# Patient Record
Sex: Male | Born: 1965 | Race: White | Hispanic: No | Marital: Married | Smoking: Current some day smoker
Health system: Southern US, Community
[De-identification: ages and names within clinical notes are randomized; demographics above are authoritative.]

## PROBLEM LIST (undated history)

## (undated) DIAGNOSIS — F329 Major depressive disorder, single episode, unspecified: Secondary | ICD-10-CM

## (undated) DIAGNOSIS — I499 Cardiac arrhythmia, unspecified: Secondary | ICD-10-CM

## (undated) DIAGNOSIS — I10 Essential (primary) hypertension: Secondary | ICD-10-CM

## (undated) DIAGNOSIS — E785 Hyperlipidemia, unspecified: Secondary | ICD-10-CM

## (undated) DIAGNOSIS — IMO0002 Reserved for concepts with insufficient information to code with codable children: Secondary | ICD-10-CM

## (undated) DIAGNOSIS — F32A Depression, unspecified: Secondary | ICD-10-CM

## (undated) DIAGNOSIS — N200 Calculus of kidney: Secondary | ICD-10-CM

## (undated) DIAGNOSIS — N189 Chronic kidney disease, unspecified: Secondary | ICD-10-CM

## (undated) DIAGNOSIS — F419 Anxiety disorder, unspecified: Secondary | ICD-10-CM

## (undated) HISTORY — PX: COLONOSCOPY: SHX174

## (undated) HISTORY — PX: LITHOTRIPSY: SUR834

## (undated) HISTORY — PX: POLYPECTOMY: SHX149

## (undated) HISTORY — DX: Hyperlipidemia, unspecified: E78.5

## (undated) HISTORY — DX: Cardiac arrhythmia, unspecified: I49.9

---

## 2001-02-02 ENCOUNTER — Emergency Department (HOSPITAL_COMMUNITY): Admission: EM | Admit: 2001-02-02 | Discharge: 2001-02-02 | Payer: Self-pay | Admitting: *Deleted

## 2001-02-02 ENCOUNTER — Encounter: Payer: Self-pay | Admitting: *Deleted

## 2001-02-10 ENCOUNTER — Encounter: Payer: Self-pay | Admitting: Urology

## 2001-02-10 ENCOUNTER — Ambulatory Visit (HOSPITAL_COMMUNITY): Admission: RE | Admit: 2001-02-10 | Discharge: 2001-02-10 | Payer: Self-pay | Admitting: Urology

## 2002-01-06 ENCOUNTER — Observation Stay (HOSPITAL_COMMUNITY): Admission: EM | Admit: 2002-01-06 | Discharge: 2002-01-07 | Payer: Self-pay | Admitting: Emergency Medicine

## 2002-01-06 ENCOUNTER — Encounter: Payer: Self-pay | Admitting: Cardiology

## 2002-01-07 ENCOUNTER — Encounter: Payer: Self-pay | Admitting: Cardiology

## 2003-12-23 ENCOUNTER — Encounter: Admission: RE | Admit: 2003-12-23 | Discharge: 2003-12-30 | Payer: Self-pay | Admitting: Internal Medicine

## 2005-02-02 ENCOUNTER — Emergency Department (HOSPITAL_COMMUNITY): Admission: EM | Admit: 2005-02-02 | Discharge: 2005-02-02 | Payer: Self-pay | Admitting: Emergency Medicine

## 2006-04-16 ENCOUNTER — Ambulatory Visit: Payer: Self-pay | Admitting: Gastroenterology

## 2007-12-26 ENCOUNTER — Emergency Department (HOSPITAL_COMMUNITY): Admission: EM | Admit: 2007-12-26 | Discharge: 2007-12-27 | Payer: Self-pay | Admitting: Emergency Medicine

## 2008-06-17 ENCOUNTER — Emergency Department (HOSPITAL_COMMUNITY): Admission: EM | Admit: 2008-06-17 | Discharge: 2008-06-17 | Payer: Self-pay | Admitting: Emergency Medicine

## 2008-06-17 ENCOUNTER — Encounter (INDEPENDENT_AMBULATORY_CARE_PROVIDER_SITE_OTHER): Payer: Self-pay | Admitting: *Deleted

## 2008-10-07 ENCOUNTER — Encounter (INDEPENDENT_AMBULATORY_CARE_PROVIDER_SITE_OTHER): Payer: Self-pay | Admitting: *Deleted

## 2008-10-07 ENCOUNTER — Ambulatory Visit (HOSPITAL_COMMUNITY): Admission: RE | Admit: 2008-10-07 | Discharge: 2008-10-07 | Payer: Self-pay | Admitting: Urology

## 2008-10-28 ENCOUNTER — Ambulatory Visit (HOSPITAL_COMMUNITY): Admission: RE | Admit: 2008-10-28 | Discharge: 2008-10-28 | Payer: Self-pay | Admitting: Urology

## 2008-10-28 ENCOUNTER — Encounter (INDEPENDENT_AMBULATORY_CARE_PROVIDER_SITE_OTHER): Payer: Self-pay | Admitting: *Deleted

## 2009-02-24 ENCOUNTER — Encounter: Payer: Self-pay | Admitting: Gastroenterology

## 2009-03-24 ENCOUNTER — Ambulatory Visit: Payer: Self-pay | Admitting: Gastroenterology

## 2009-03-24 DIAGNOSIS — K921 Melena: Secondary | ICD-10-CM

## 2009-04-07 ENCOUNTER — Ambulatory Visit: Payer: Self-pay | Admitting: Gastroenterology

## 2009-04-07 ENCOUNTER — Encounter: Payer: Self-pay | Admitting: Gastroenterology

## 2009-04-08 ENCOUNTER — Encounter (INDEPENDENT_AMBULATORY_CARE_PROVIDER_SITE_OTHER): Payer: Self-pay

## 2009-04-11 ENCOUNTER — Encounter: Payer: Self-pay | Admitting: Gastroenterology

## 2009-05-16 ENCOUNTER — Ambulatory Visit: Payer: Self-pay | Admitting: Gastroenterology

## 2010-07-29 ENCOUNTER — Encounter: Payer: Self-pay | Admitting: Gastroenterology

## 2010-07-29 ENCOUNTER — Encounter: Payer: Self-pay | Admitting: Family Medicine

## 2010-10-18 LAB — BASIC METABOLIC PANEL
BUN: 10 mg/dL (ref 6–23)
CO2: 26 mEq/L (ref 19–32)
Calcium: 8.6 mg/dL (ref 8.4–10.5)
Chloride: 107 mEq/L (ref 96–112)
Creatinine, Ser: 0.79 mg/dL (ref 0.4–1.5)
GFR calc non Af Amer: >60 mL/min (ref >60)
Glucose, Bld: 107 mg/dL — ABNORMAL HIGH (ref 70–99)
Potassium: 3.7 mEq/L (ref 3.5–5.1)
Sodium: 139 mEq/L (ref 135–145)

## 2010-10-18 LAB — HEMOGLOBIN AND HEMATOCRIT, BLOOD
HCT: 43.2 % (ref 39.0–52.0)
Hemoglobin: 14.6 g/dL (ref 13.0–17.0)

## 2010-10-27 ENCOUNTER — Other Ambulatory Visit: Payer: Self-pay

## 2010-10-27 ENCOUNTER — Other Ambulatory Visit: Payer: Self-pay | Admitting: Family Medicine

## 2010-10-27 DIAGNOSIS — M25562 Pain in left knee: Secondary | ICD-10-CM

## 2010-10-28 ENCOUNTER — Ambulatory Visit
Admission: RE | Admit: 2010-10-28 | Discharge: 2010-10-28 | Disposition: A | Discharge status: Home / Self Care | Payer: 59 | Source: Ambulatory Visit | Attending: Family Medicine | Admitting: Family Medicine

## 2010-10-28 DIAGNOSIS — M25562 Pain in left knee: Secondary | ICD-10-CM

## 2010-11-21 NOTE — Op Note (Signed)
[name] [name], [name] [name]              ACCOUNT NO.:  [id number]   MEDICAL RECORD NO.:  [id number]          PATIENT TYPE:  AMB   LOCATION:  DAY                          FACILITY:  [facility]   PHYSICIAN:  Mark C. Vernie Ammons, M.D.  DATE OF BIRTH:  1966/01/10   DATE OF PROCEDURE:  10/07/2008  DATE OF DISCHARGE:                               OPERATIVE REPORT   PREOPERATIVE DIAGNOSIS:  Right ureteral calculi.   POSTOPERATIVE DIAGNOSIS:  Impacted right ureteral calculi.   PROCEDURE:  1. Cystoscopy.  2. Right ureteroscopy.  3. Attempted double-J stent placement.   ANESTHESIA:  General.   SPECIMENS:  None.   DRAINS:  None.   BLOOD LOSS:  None.   COMPLICATIONS:  None.   INDICATIONS:  The patient is a 45 year old male who has two stones in  his upper right ureter.  He is been having pain and we had discussed the  treatment options.  He has elected to proceed with lithotripsy since the  stones were very large and in preparation for the lithotripsy and I had  recommended a double-J stent placement.  He is brought to the operating  room today for placement of a double-J stent and I have discussed the  risks, complications and alternatives with him.   DESCRIPTION OF OPERATION:  After informed consent, the patient was  brought to the major OR, placed on the table and administered general  anesthesia.  He was then moved to the dorsal lithotomy position and his  genitalia was sterilely prepped and draped and official time-out was  then performed.  Initially the 22-French cystoscope was passed directly  into the bladder per urethra with no abnormalities being noted along the  course of the urethra or in the prostatic urethra.  The bladder was then  entered with the 12 degrees lens and fully inspected and noted be free  of any tumor, stones or inflammatory lesions.  The right orifice was  then identified.   A 6-French open-ended ureteral catheter was then placed in the right  ureteral orifice and  through this a 0.038 inch floppy tip guidewire was  then passed up into the area of the ureteral stones under direct  fluoroscopic control.  I could not negotiate the guidewire passed the  stone.  I therefore passed the open-ended catheter up the ureter, over  the guidewire and just below the stone and again tried to manipulate the  guidewire passed the stone but was unsuccessful.  I therefore removed  the open-ended catheter and left the guidewire in place.   The 6-French flexible ureteroscope was then passed over the guidewire  and up the ureter and the guidewire was removed after the scope had been  positioned just below the stone.  I was able to visualize the stone and  it appeared to be impacted in the ureter.  I then tried to pass the  guidewire as well as a Glidewire under direct visualization around the  stone at all different aspects of the stone but was unsuccessful.  My  feeling was that further attempts to pass a guidewire increased the risk  of possible ureteral perforation and since the patient was scheduled for  lithotripsy of his stone later in the day  I felt no further attempt at  passing a guidewire beyond the stone should be performed.  Of note, the  holmium laser was unavailable as it was being used in another case.   I removed the ureteroscope and drained the bladder.  The patient was  awakened and taken to recovery room in stable and satisfactory  condition.  He tolerated the procedure well with no intraoperative  complications and will be taken to the lithotripsy unit and undergo  lithotripsy later today of his impacted stone and possibly the stone  more proximal to that.      Mark C. Vernie Ammons, M.D.  Electronically Signed     MCO/MEDQ  D:  10/07/2008  T:  10/07/2008  Job:  [id number]

## 2010-11-24 NOTE — Assessment & Plan Note (Signed)
[name]                           GASTROENTEROLOGY OFFICE NOTE   NAME:[name], [name] [name]                     MRN:          [id number]  DATE:04/16/2006                            DOB:          [birth year]    REFERRING PHYSICIAN:  Wardell Heath, MD   REASON FOR REFERRAL:  Neck pain and difficulty swallowing.   HISTORY OF PRESENT ILLNESS:  [name] [name] is a 45 year old white male who  relates intermittent difficulties with a cramp-like sensation in his neck  and throat that makes him feel as though he cannot swallow or breathe.  He  does have heartburn related to certain foods, but these symptoms are well  controlled with the p.r.n. use of TUMS.  His neck and throat cramping and  tightness does not appear to be related to reflux symptoms.  The symptoms  may last 1 to 2 minutes at a time and occur at anytime of the day or night.  They do not appear to be related to meals or swallowing.  He has no  odynophagia, abdominal pain, melena, hematochezia, change in bowel habits,  change in stool caliber.  His father has a history of adenomatous colon  polyps and also ALS.  The patient is very concerned that his symptoms may be  an early sign of ALS.   PAST MEDICAL HISTORY:  1. Hypertension.  2. History of chest pain, status post normal cardiac catheterization in      1998.  3. History of kidney stones, status post lithotripsy x2.  4. Status post right hand fracture.   CURRENT MEDICATIONS:  1. Zoloft q.d.  2. Hydrochlorothiazide q.d.  3. Hydrocodone p.r.n.   MEDICATION ALLERGIES:  NONE KNOWN.   SOCIAL HISTORY:  Per the handwritten form.   REVIEW OF SYMPTOMS:  Per the handwritten form.   PHYSICAL EXAMINATION:  VITAL SIGNS:  Height 5 feet 11 inches.  Weight 184.2  pounds.  Blood pressure is 124/82.  Pulse 80 and regular.  HEENT:  Anicteric sclerae.  Oropharynx clear.  NECK:  Without thyromegaly, adenopathy or tenderness.  Neck is supple.  CHEST:   Clear to auscultation bilaterally.  CARDIAC:  Regular rate and rhythm without murmurs appreciated.  ABDOMEN:  Abdomen is soft, non-tender and non-distended.  Normoactive bowel  sounds.  No palpable organomegaly, masses or hernias.  RECTAL:  Examination deferred.  NEUROLOGIC:  Alert and oriented x3.  Grossly nonfocal.   ASSESSMENT AND PLAN:  Neck and throat tightness.  I do not feel this is  likely gastrointestinal in etiology.  Schedule a barium esophagram for  further evaluation.  ENT referral for further evaluation.       Venita Lick. Russella Dar, MD, [facility]      MTS/MedQ  DD:  04/22/2006  DT:  04/23/2006  Job #:  [id number]   cc:   Paulene Floor, FNP

## 2010-11-24 NOTE — H&P (Signed)
[place]. [facility]  Patient:    [name] [name], [name] [name] Visit Number: [id number] MRN: [id number]          Service Type: OBV Location: 3700 [id number] Attending Physician:  Learta Codding Dictated by:   Lewayne Bunting, M.D. [facility] Admit Date:  01/06/2002 Discharge Date: 01/07/2002   CC:         Dr. Reola Calkins, [facility]   History and Physical  REFERRING PHYSICIAN:  Dr. Reola Calkins, [facility].  CARDIOLOGIST:  Samara Deist, M.D.  CHIEF COMPLAINT:  Chest pain times several years.  Troponin obtained today at 0.04.  HISTORY OF PRESENT ILLNESS:  Mr. [name] [name] is a 45 year old male with no prior history of coronary artery disease.  The patient is status post cardiac catheterization approximately to six or seven years ago and reportedly was within normal limits.  The patient has been recently seeing Dr. Reola Calkins for evaluation of recurrent left-sided substernal chest pain.  The patient reports left thoracic pain with burning deep inside.  He states it is associated with some tingling and numbness in both arms and fingers.  There is no definite correlation with exertion or with eating.  He does state that stress significantly worsens his chest pain.  It feels like he has a constant ache all day long with pain never truly going away.  He is actually a very active individual and denies any shortness of breath on exertion.  Presently in the emergency room tonight the patient was pain-free.  Dr. Reola Calkins had earlier today consulted with Dr. Graciela Husbands regarding the patients presentation, and Dr. Graciela Husbands recommended to obtain a troponin level.  Troponin level returned at 0.04, a borderline value.  The patient was subsequently taken to the emergency room at [facility] by his wife after consultation with Dr. Reola Calkins for further evaluation.  Of note, the patients symptoms are not _____, and he has been taking ibuprofen p.r.n.  ALLERGIES:  No known drug  allergies.  No iodine or shellfish allergy.  MEDICATIONS:  Ibuprofen p.r.n.  PAST MEDICAL HISTORY: 1. Cardiac catheterization seven years ago, possibly at [facility], no    records available, but per patient report negative. 2. Status post right lithotripsy x2. 3. Right hand fracture.  SOCIAL HISTORY:  The patient lives in [place] with his wife.  He works in Production designer, theatre/television/film.  He is married and has one child.  He has a 15-20 pack-year history of smoking.  Occasionally drinks alcohol.  He has a very remote history of possible substance abuse.  FAMILY HISTORY:  Noncontributory.  REVIEW OF SYSTEMS:  No fever, chills, or sweats.  No headache or sore throat. No dyspnea on exertion, orthopnea, or PND.  Occasional palpitations.  No frequency or dysuria.  No weakness but numbness as outlined above.  No nausea or vomiting.  PHYSICAL EXAMINATION:  VITAL SIGNS:  Blood pressure 161/93, respirations are 18, pulse is 80 beats per minute, saturation 98% on room air.  GENERAL:  A 45 year old male in no apparent distress.  HEENT:  PERRLA.  NECK:  Supple, normal carotid upstroke, no carotid bruits, no lymphadenopathy.  CHEST:  Clear breath sounds bilaterally.  CARDIAC:  Normal S1, S2, no murmurs, rubs, or gallops.  SKIN:  No rash or lesions.  ABDOMEN:  Soft, nontender, no rebound or guarding.  GENITOURINARY, RECTAL:  Deferred.  EXTREMITIES:  No cyanosis, clubbing, or edema.  NEUROLOGIC:  The patient is alert and oriented.  Cranial nerves II-XII grossly intact.  No focal  findings.  LABORATORY DATA:  Chest x-ray is currently pending.  EKG:  Heart rate 68 beats per minute, normal sinus rhythm.  Q-waves in II, III, aVF, as well as V5, V6. No acute ischemic changes.  Left ventricular hypertrophy.  His Q-waves are small, unchanged from prior electrocardiogram, but there is significant left ventricular hypertrophy.  Laboratory work is pending.  ASSESSMENT AND PLAN: 1. Atypical chest  pain, chronic, now for several months.  Borderline troponin    value.  I do not think the patient has significant coronary artery disease.    Troponin levels will be repeated.  If the patient indeed had a negative    cardiac catheterization, he can proceed with a Cardiolite study in the    morning.  In talking with the patient, his chest pain syndrome appears to    be clearly exacerbated by stress and appears to be more musculoskeletal in    nature.  I have talked to the patient that if his Cardiolite study is    negative, he may need further evaluation and workup by Dr. Reola Calkins. 2. Multiple cardiac risk factors.  The patient clearly has hypertension that    should be treated, probably needs to be started on a diuretic or a beta    blocker.  EKG demonstrates left ventricular hypertrophy. 3. History of nephrolithiasis, status post lithotripsy.  Doubt this represents    renal colic.  DISPOSITION:  The patient will be admitted, evaluated with serial enzymes as well as a D-dimer level and possibly scheduled for Cardiolite study in the morning. Dictated by:   Lewayne Bunting, M.D. [facility] Attending Physician:  Learta Codding DD:  01/06/02 TD:  01/09/02 Job: [id number]

## 2010-11-24 NOTE — Discharge Summary (Signed)
[place]. [facility]  Patient:    ALEGANDRO, MACNAUGHTON Visit Number: [id number] MRN: [id number]          Service Type: OBV Location: 3700 [id number] Attending Physician:  Learta Codding Dictated by:   Delton See, P.A. Admit Date:  01/06/2002 Discharge Date: 01/07/2002   CC:         Gaynelle Cage, M.D., [facility]   Referring Physician Discharge [place]  DATE OF BIRTH:  April 16, 1966  BRIEF HISTORY:  Mr. Blyden is a 45 year old male with no previous history of coronary artery disease, who is referred by Dr. Reola Calkins for evaluation of recurrent chest pain.  The patient was admitted to the hospital for further evaluation.  PAST MEDICAL HISTORY:  The patient has a history of a right hand fracture.  He had a right lithotripsy x 2; the last time was in August 2002.  He had a negative catheterization approximately seven years ago.  We believe this was done at [facility].  ALLERGIES:  No known drug allergies.  MEDICATIONS PRIOR TO ADMISSION:  Ibuprofen p.r.n.  SOCIAL HISTORY:  The patient lives in [place] with his wife.  He works maintenance.  He has one son, age 42.  He has a history of 15-10 pack years of tobacco use.  HOSPITAL COURSE:  As noted, this patient was admitted to [facility] for further evaluation of chest pain.  His blood pressure was noted to be elevated at the time of admission 161/93.  It was felt that the patient had untreated hypertension.  He was placed on hydrochlorothiazide 12.5 mg daily for his high blood pressure.  The patient was scheduled for an exercise Cardiolite which was performed on January 07, 2002.  This was negative for ischemia.  Ejection fraction was estimated to be normal at 60%.  Arrangements were made to discharge the patient later that evening.  Prior to discharge, it was noted that his potassium was 3.3.  He was placed on a potassium supplement, specifically K-Dur 20 mEq daily.   He was instructed to have his potassium level checked at his next physician visit.  He was discharged in stable and improved condition.  LABORATORY DATA:  A CBC on the day of admission revealed hemoglobin 14.6, hematocrit 43.1, WBC 8.3, platelets 262,000, BUN 12, creatinine 1.0. Potassium 3.3, glucose 148.  The remainder was within normal limits.  INR was 0.9, and PTT was 31.  Cardiac enzymes were negative x 2.  Lipid profile is currently pending.  A TSH is pending.  A C-reactive protein is pending.  A D-dimer was 0.32 which is within normal limits.  TSH did come back at 2.725 within normal limits.  C-reactive protein came back normal at 0.4.  A chest x-ray showed no active disease.  His EKG showed normal sinus rhythm, rate 68 beats per minute and was interpreted as a normal EKG.  DISCHARGE MEDICATIONS: 1. Hydrochlorothiazide 12.5 mg q.d. 2. K-Dur 20 mEq q.d.  DISCHARGE INSTRUCTIONS: 1. The patient was told to walk 30 minutes per day for exercise. 2. He was to be on a heart healthy diet, low fat, low salt. 3. He was told to quit smoking. 4. He was to have a potassium level checked at next office visit within 1-2    weeks. 5. He was to follow up with Dr. Reola Calkins for treatment for further evaluation of    cholesterol and for high blood pressure. 6. He was to see Dr.  DeGent only as needed.  PROBLEM LIST AT TIME OF DISCHARGE: 1. Chest pain, negative cardiac enzymes x 2. 2. Exercise Cardiolite performed January 07, 2002, no ischemia, ejection fraction    60%, interpreted as normal. 3. Positive tobacco history.  It was recommended that the patient quit    smoking. 4. Hypokalemia, supplemented. 5. Hypertension, treated with hydrochlorothiazide, started this admission. 6. As noted, a lipid profile is current pending. Dictated by:   Delton See, P.A. Attending Physician:  Learta Codding DD:  01/07/02 TD:  01/07/02 Job: [id number]

## 2011-04-13 LAB — POCT I-STAT, CHEM 8
BUN: 14 mg/dL (ref 6–23)
Chloride: 103 mEq/L (ref 96–112)
HCT: 50 % (ref 39.0–52.0)
Sodium: 138 mEq/L (ref 135–145)
TCO2: 25 mmol/L (ref 0–100)

## 2011-04-13 LAB — CBC
HCT: 47.4 % (ref 39.0–52.0)
Hemoglobin: 15.9 g/dL (ref 13.0–17.0)
MCV: 89 fL (ref 78.0–100.0)
RBC: 5.32 MIL/uL (ref 4.22–5.81)
WBC: 15.6 10*3/uL — ABNORMAL HIGH (ref 4.0–10.5)

## 2011-04-13 LAB — DIFFERENTIAL
Eosinophils Absolute: 0.2 10*3/uL (ref 0.0–0.7)
Eosinophils Relative: 1 % (ref 0–5)
Lymphs Abs: 1.7 10*3/uL (ref 0.7–4.0)
Monocytes Absolute: 1.1 10*3/uL — ABNORMAL HIGH (ref 0.1–1.0)
Monocytes Relative: 7 % (ref 3–12)

## 2011-04-13 LAB — URINALYSIS, ROUTINE W REFLEX MICROSCOPIC
Glucose, UA: NEGATIVE mg/dL
Ketones, ur: NEGATIVE mg/dL
Protein, ur: NEGATIVE mg/dL

## 2011-04-13 LAB — URINE MICROSCOPIC-ADD ON

## 2011-09-07 HISTORY — PX: CYSTOSCOPY W/ URETERAL STENT PLACEMENT: SHX1429

## 2011-10-02 DIAGNOSIS — N200 Calculus of kidney: Secondary | ICD-10-CM | POA: Insufficient documentation

## 2011-10-08 HISTORY — PX: CYSTOSCOPY W/ URETERAL STENT REMOVAL: SHX1430

## 2012-03-26 ENCOUNTER — Other Ambulatory Visit: Payer: Self-pay | Admitting: Urology

## 2012-03-27 ENCOUNTER — Encounter (HOSPITAL_COMMUNITY): Payer: Self-pay | Admitting: *Deleted

## 2012-03-27 NOTE — Pre-Procedure Instructions (Signed)
Asked to bring blue folder the day of the procedure,insurance card,I.D. driver's license,wear comfortable clothing and have a driver for the day. Asked not to take Advil,Motrin,Ibuprofen,Aleve or any NSAIDS, Aspirin, or Toradol for 72 hours prior to procedure,  No vitamins or herbal medications 7 days prior to procedure. Instructed to take laxative per doctor's office instructions and eat a light dinner the evening before procedure.   To arrive at 1530 for lithotripsy procedure. 

## 2012-03-31 ENCOUNTER — Ambulatory Visit (HOSPITAL_COMMUNITY): Payer: 59

## 2012-03-31 ENCOUNTER — Ambulatory Visit (HOSPITAL_COMMUNITY)
Admission: RE | Admit: 2012-03-31 | Discharge: 2012-03-31 | Disposition: A | Discharge status: Home / Self Care | Payer: 59 | Source: Ambulatory Visit | Attending: Urology | Admitting: Urology

## 2012-03-31 ENCOUNTER — Encounter (HOSPITAL_COMMUNITY): Payer: Self-pay | Admitting: *Deleted

## 2012-03-31 ENCOUNTER — Encounter (HOSPITAL_COMMUNITY): Payer: Self-pay | Admitting: Pharmacy Technician

## 2012-03-31 ENCOUNTER — Encounter (HOSPITAL_COMMUNITY)
Admission: RE | Disposition: A | Discharge status: Home / Self Care | Payer: Self-pay | Source: Ambulatory Visit | Attending: Urology

## 2012-03-31 DIAGNOSIS — R12 Heartburn: Secondary | ICD-10-CM | POA: Insufficient documentation

## 2012-03-31 DIAGNOSIS — Z79899 Other long term (current) drug therapy: Secondary | ICD-10-CM | POA: Insufficient documentation

## 2012-03-31 DIAGNOSIS — N201 Calculus of ureter: Secondary | ICD-10-CM | POA: Insufficient documentation

## 2012-03-31 DIAGNOSIS — I10 Essential (primary) hypertension: Secondary | ICD-10-CM | POA: Insufficient documentation

## 2012-03-31 HISTORY — DX: Anxiety disorder, unspecified: F41.9

## 2012-03-31 HISTORY — DX: Major depressive disorder, single episode, unspecified: F32.9

## 2012-03-31 HISTORY — DX: Chronic kidney disease, unspecified: N18.9

## 2012-03-31 HISTORY — DX: Essential (primary) hypertension: I10

## 2012-03-31 HISTORY — DX: Depression, unspecified: F32.A

## 2012-03-31 SURGERY — LITHOTRIPSY, ESWL
Anesthesia: LOCAL | Laterality: Right

## 2012-03-31 MED ORDER — DEXTROSE-NACL 5-0.45 % IV SOLN
INTRAVENOUS | Status: DC
  Administered 2012-03-31: 16:00:00 via INTRAVENOUS

## 2012-03-31 MED ORDER — DIAZEPAM 5 MG PO TABS
10.0000 mg | ORAL_TABLET | ORAL | Status: AC
  Administered 2012-03-31: 10 mg via ORAL
  Filled 2012-03-31: qty 2

## 2012-03-31 MED ORDER — CIPROFLOXACIN HCL 500 MG PO TABS
500.0000 mg | ORAL_TABLET | ORAL | Status: AC
  Administered 2012-03-31: 500 mg via ORAL
  Filled 2012-03-31: qty 1

## 2012-03-31 MED ORDER — DIPHENHYDRAMINE HCL 25 MG PO CAPS
25.0000 mg | ORAL_CAPSULE | ORAL | Status: AC
  Administered 2012-03-31: 25 mg via ORAL
  Filled 2012-03-31: qty 1

## 2012-03-31 NOTE — Progress Notes (Signed)
No urine seen in patient's bladder after scanning with bladder scan after void. Post lithotripsy. Urine was cherry colored with no clots.

## 2012-03-31 NOTE — Interval H&P Note (Signed)
History and Physical Interval Note:  03/31/2012 7:48 AM  [name] [name]  has presented today for surgery, with the diagnosis of Right Ureteral Stone  The various methods of treatment have been discussed with the patient and family. After consideration of risks, benefits and other options for treatment, the patient has consented to  Procedure(s) (LRB) with comments: EXTRACORPOREAL SHOCK WAVE LITHOTRIPSY (ESWL) (Right) as a surgical intervention .  The patient's history has been reviewed, patient examined, no change in status, stable for surgery.  I have reviewed the patient's chart and labs.  Questions were answered to the patient's satisfaction.     Gurtha Picker A

## 2012-03-31 NOTE — H&P (Signed)
Reason For Visit  [name] [name] is a 46 year old male with nephrolithiasis.   History of Present Illness  Nephrolithiasis: In 02/10/01 underwent lithotripsy for a right ureteral stone.  He has also undergone ureteroscopy in the past.   He was found to have 2 stones in his upper right ureter and because of the large volume of stone and and their size I felt the placement of the stent followed by lithotripsy would be the best way to manage his stones. On 10/07/08 I attempted to place a stent in the right ureter however the stone that was most distal was impacted and I could not get stent or even a guidewire past the stone even under direct visualization using flexible ureteroscopy. I therefore performed lithotripsy that same day on the impacted distal ureteral stone with what appeared to be good breakup of the stone at the time of the procedure. The second stone was treated with ESWL on 10/28/08 with excellent break up.   Interval history:   Past Medical History Problems  1. History of  Anxiety (Symptom) 300.00 2. History of  Heartburn With Regurgitation 787.1 3. History of  Hypertension 401.9 4. History of  Ureteral Stone Right 592.1  Surgical History Problems  1. History of  Cystoscopy With Ureteroscopy Right 2. History of  Lithotripsy Right 3. History of  Lithotripsy 4. History of  Lithotripsy 5. History of  Lithotripsy  Current Meds 1. Hydrochlorothiazide CAPS; Therapy: (Recorded:29Mar2010) to 2. Oxycodone-Acetaminophen 10-325 MG Oral Tablet; TAKE 1 TABLET EVERY 4 TO 6 HOURS AS  NEEDED FOR PAIN; Therapy: 16Sep2013 to (Evaluate:23Sep2013); Last Rx:16Sep2013 3. Percocet TABS; Therapy: (Recorded:29Mar2010) to 4. Promethazine HCl 25 MG Oral Tablet; TAKE 1 TABLET Every 8 hours; Therapy: 16Sep2013 to  (Last Rx:16Sep2013)  Requested for: 17Sep2013 5. Zoloft TABS; Therapy: (Recorded:29Mar2010) to  Allergies No Known Allergies  1. No Known Allergies  Family History Problems  1. Family  history of  Family Health Status Number Of Children 1 child  Social History Problems  1. Alcohol Use 2. Caffeine Use 3 cups a day 3. Former Smoker 1 pack per day 4. Marital History - Currently Married  Vitals Vital Signs [Data Includes: Last 1 Day]  18Sep2013 10:09AM  BMI Calculated: 25.48 BSA Calculated: 2.02 Height: 5 ft 11 in Weight: 182 lb  Blood Pressure: 102 / 64 Temperature: 97.3 F Heart Rate: 60  Results/Data  The following images/tracing/specimen were independently visualized:  CT scan and KUB: His stone is visible just below the sacrum on the right. Selected Results  URINE CULTURE 16Sep2013 05:20PM Alianah Lofton  Source : CLEAN CATCH SPECIMEN TYPE: CLEAN CATCH   Test Name Result Flag Reference  CULTURE, URINE Culture, Urine    ===== COLONY COUNT: =====  NO GROWTH   FINAL REPORT: NO GROWTH   AU CT-STONE PROTOCOL 16Sep2013 12:00AM Alfredo Martinez   Test Name Result Flag Reference  ** RADIOLOGY REPORT BY [facility] **   *RADIOLOGY REPORT*  Clinical Data: Right flank and right lower quadrant pain. History of urinary tract stones.  CT ABDOMEN AND PELVIS WITHOUT CONTRAST (URINARY CALCULUS PROTOCOL)  Technique: Multidetector CT imaging was performed through the abdomen and pelvis without intravenous contrast to include the urinary tract.  Comparison: CT abdomen and pelvis 06/07/2009.  Findings: Imaged lung parenchyma is clear. No pleural or pericardial effusion.  The patient has severe right hydronephrosis with stranding about the right kidney and ureter due to a 0.6 cm distal right ureteral stone. A punctate stone is identified in the  upper pole of the right kidney and in the lower pole right kidney. There is also a 1.0 cm nonobstructing stone in the lower pole of the right kidney which is new since the prior study. A single punctate nonobstructing stone is identified in the upper pole of the left kidney.  The liver,  gallbladder, spleen, adrenal glands and pancreas appear normal. Urinary bladder, seminal vesicles and prostate gland are unremarkable. The stomach and small and large bowel appear normal. There is no lymphadenopathy or fluid. No focal bony abnormality.  IMPRESSION:  1. Severe right hydronephrosis due to a 0.6 cm distal right ureteral stone. 2. Three nonobstructing stones in the right kidney as described above. Single nonobstructing stone left kidney also identified.   Original Report Authenticated By: Bernadene Bell. Maricela Curet, M.D.   Assessment Assessed  1. Distal Ureteral Stone On The Right 592.1   He is not having as severe pain but still is uncomfortable. We therefore discussed the treatment options. I discussed ureteroscopy with him in detail and then we discussed lithotripsy. I went over the procedure with him in detail including its risks, complications and probability of success. He understands and has elected to proceed with lithotripsy at this time. Since I will not be in town in the near future I have spoken to Dr. Sherron Monday who has kindly agreed to treat his stone with lithotripsy as soon as possible. The patient is in agreement with this plan.   Plan  He will be scheduled for lithotripsy of his right distal ureteral stone.   After a thorough review of the management options for the patient's condition the patient  elected to proceed with surgical therapy as noted above. We have discussed the potential benefits and risks of the procedure, side effects of the proposed treatment, the likelihood of the patient achieving the goals of the procedure, and any potential problems that might occur during the procedure or recuperation. Informed consent has been obtained.

## 2012-09-26 ENCOUNTER — Other Ambulatory Visit: Payer: Self-pay | Admitting: Nurse Practitioner

## 2012-09-26 ENCOUNTER — Other Ambulatory Visit: Payer: Self-pay | Admitting: *Deleted

## 2012-09-26 MED ORDER — AMLODIPINE BESY-BENAZEPRIL HCL 5-10 MG PO CAPS: 1.0000 | ORAL_CAPSULE | Freq: Every morning | ORAL | Status: DC

## 2012-09-26 MED ORDER — SERTRALINE HCL 100 MG PO TABS: 100.0000 mg | ORAL_TABLET | Freq: Every morning | ORAL | Status: DC

## 2012-09-30 ENCOUNTER — Other Ambulatory Visit: Payer: Self-pay

## 2012-09-30 MED ORDER — SERTRALINE HCL 100 MG PO TABS: 100.0000 mg | ORAL_TABLET | Freq: Every morning | ORAL | Status: DC

## 2012-12-31 ENCOUNTER — Encounter: Payer: Self-pay | Admitting: Family Medicine

## 2012-12-31 ENCOUNTER — Ambulatory Visit (INDEPENDENT_AMBULATORY_CARE_PROVIDER_SITE_OTHER): Payer: 59 | Admitting: Family Medicine

## 2012-12-31 VITALS — BP 113/69 | HR 68 | Temp 98.1°F | Ht 68.0 in | Wt 185.0 lb

## 2012-12-31 DIAGNOSIS — K625 Hemorrhage of anus and rectum: Secondary | ICD-10-CM

## 2012-12-31 DIAGNOSIS — Z0000 Encounter for general adult medical examination without abnormal findings: Secondary | ICD-10-CM

## 2012-12-31 DIAGNOSIS — K649 Unspecified hemorrhoids: Secondary | ICD-10-CM

## 2012-12-31 DIAGNOSIS — N200 Calculus of kidney: Secondary | ICD-10-CM

## 2012-12-31 LAB — POCT CBC
Granulocyte percent: 38.1 %G (ref 37–80)
HCT, POC: 39.1 % — AB (ref 43.5–53.7)
Hemoglobin: 14.4 g/dL (ref 14.1–18.1)
Lymph, poc: 4.3 — AB (ref 0.6–3.4)
MCH, POC: 31.2 pg (ref 27–31.2)
MCHC: 36.7 g/dL — AB (ref 31.8–35.4)
MCV: 84.8 fL (ref 80–97)
MPV: 7.3 fL (ref 0–99.8)
POC Granulocyte: 3 (ref 2–6.9)
POC LYMPH PERCENT: 55.2 %L — AB (ref 10–50)
Platelet Count, POC: 203 10*3/uL (ref 142–424)
RBC: 4.6 M/uL — AB (ref 4.69–6.13)
RDW, POC: 12.8 %
WBC: 7.8 10*3/uL (ref 4.6–10.2)

## 2012-12-31 MED ORDER — HYDROCODONE-ACETAMINOPHEN 10-325 MG PO TABS: 1.0000 | ORAL_TABLET | Freq: Four times a day (QID) | ORAL | Status: DC | PRN

## 2012-12-31 MED ORDER — HYDROCORTISONE ACETATE 25 MG RE SUPP: 25.0000 mg | Freq: Two times a day (BID) | RECTAL | Status: DC

## 2012-12-31 NOTE — Progress Notes (Signed)
  Subjective:    Patient ID: [name] [name], male    DOB: September 10, 1965, 47 y.o.   MRN: [id number]  HPI  This 47 y.o. male presents for evaluation of right back pain radiating down to his bladder. He has hx of kidney stones and he has been passing stones.  He has a urologist and has had to have 7 lithotripsies in the past according to patient. He is here to get CPE.  He has been having rectal bleeding for the last 2 weeks.  He reports that he has been bleeding from his commode and across the floor.  He feels weak.  He has had a colonoscopy a few years ago and states he had a polyp removed that was benign.  He had hemorrhoids then but states they are getting worse.   Review of Systems C/o lower abdomen and back pain, kidney stone, rectal bleeding.  No chest pain, SOB, HA, dizziness, vision change, N/V, diarrhea, constipation, dysuria, urinary urgency or frequency, myalgias, arthralgias or rash.     Objective:   Physical Exam   Vital signs noted  Well developed well nourished male who appears to be in mild distress due to pain.  HEENT - Head atraumatic Normocephalic                Eyes - PERRLA, Conjuctiva - clear Sclera- Clear EOMI                Ears - EAC's Wnl TM's Wnl Gross Hearing WNL                Nose - Nares patent                 Throat - oropharanx wnl Respiratory - Lungs CTA bilateral Cardiac - RRR S1 and S2 without murmur GI - Abdomen soft Nontender and bowel sounds active x 4 Rectal - Internal Hemorroids, Prostate wnl, hemocult positive for occult blood. Extremities - No edema. Neuro - Grossly intact.     Assessment & Plan:  Routine general medical examination at a health care facility - Plan: POCT CBC, Lipid panel, TSH, PSA, COMPLETE METABOLIC PANEL WITH GFR.  Discussed patient get eye exam from optometry or opthalmology and get in to see dentist at least annually.    Rectal bleeding - Plan: Ambulatory referral to General Surgery  Hemorrhoids - Plan: hydrocortisone  (ANUSOL-HC) 25 MG suppository and referral to Surgery.  Kidney stones - Plan: HYDROcodone-acetaminophen (NORCO) 10-325 MG per tablet, push po fluids and if unable to pass in next few days need to follow up with Urologist.  Please call or follow up prn.  Follow up in one year and prn

## 2013-01-01 LAB — COMPLETE METABOLIC PANEL WITH GFR
ALT: 37 U/L (ref 0–53)
AST: 34 U/L (ref 0–37)
Albumin: 3.4 g/dL — ABNORMAL LOW (ref 3.5–5.2)
Alkaline Phosphatase: 88 U/L (ref 39–117)
BUN: 12 mg/dL (ref 6–23)
CO2: 26 mEq/L (ref 19–32)
Calcium: 8.3 mg/dL — ABNORMAL LOW (ref 8.4–10.5)
Chloride: 106 mEq/L (ref 96–112)
Creat: 0.83 mg/dL (ref 0.50–1.35)
GFR, Est African American: >89 mL/min
GFR, Est Non African American: >89 mL/min
Glucose, Bld: 78 mg/dL (ref 70–99)
Potassium: 3.9 mEq/L (ref 3.5–5.3)
Sodium: 138 mEq/L (ref 135–145)
Total Bilirubin: 0.4 mg/dL (ref 0.3–1.2)
Total Protein: 6.1 g/dL (ref 6.0–8.3)

## 2013-01-01 LAB — LIPID PANEL
Cholesterol: 117 mg/dL (ref 0–200)
HDL: 28 mg/dL — ABNORMAL LOW (ref >39)
LDL Cholesterol: 56 mg/dL (ref 0–99)
Total CHOL/HDL Ratio: 4.2 Ratio
Triglycerides: 163 mg/dL — ABNORMAL HIGH (ref <150)
VLDL: 33 mg/dL (ref 0–40)

## 2013-01-01 LAB — TSH: TSH: 0.608 u[IU]/mL (ref 0.350–4.500)

## 2013-01-01 LAB — PSA: PSA: 0.41 ng/mL (ref <=4.00)

## 2013-01-12 ENCOUNTER — Encounter (INDEPENDENT_AMBULATORY_CARE_PROVIDER_SITE_OTHER): Payer: Self-pay | Admitting: General Surgery

## 2013-01-12 ENCOUNTER — Ambulatory Visit (INDEPENDENT_AMBULATORY_CARE_PROVIDER_SITE_OTHER): Payer: 59 | Admitting: Surgery

## 2013-01-12 ENCOUNTER — Ambulatory Visit (INDEPENDENT_AMBULATORY_CARE_PROVIDER_SITE_OTHER): Payer: 59 | Admitting: General Surgery

## 2013-01-12 VITALS — BP 124/72 | HR 80 | Resp 16 | Ht 69.0 in | Wt 180.2 lb

## 2013-01-12 DIAGNOSIS — K648 Other hemorrhoids: Secondary | ICD-10-CM

## 2013-01-12 NOTE — Patient Instructions (Addendum)
Calland let us know if you would like to proceed with hemorrhoidectomy. The phone number is [phone].

## 2013-01-12 NOTE — Progress Notes (Signed)
Patient ID: [name] [name], male   DOB: Nov 27, 1965, 47 y.o.   MRN: [id number]  Chief Complaint  Patient presents with  . New Evaluation    eval bleeding hems    HPI [name] [name] is a 47 y.o. male.   HPI  He is referred by Nils Pyle, nurse practitioner, for further evaluation and treatment of bleeding internal hemorrhoids. He underwent a colonoscopy in June 2010 which demonstrated internal hemorrhoids. These were injected. Recently, he is noted bleeding and prolapse with most bowel movements. He's been given some suppositories. He is here for further evaluation and treatment. He states he typically does not have to strain to have a bowel movement. He takes in 5-10 minutes. He is an Personnel officer.  Past Medical History  Diagnosis Date  . Hypertension   . Depression   . Anxiety   . Chronic kidney disease     kidney stones    Past Surgical History  Procedure Laterality Date  . Lithotripsy    . Cystoscopy w/ ureteral stent placement  09-2011  . Cystoscopy w/ ureteral stent removal  10-2011    History reviewed. No pertinent family history.  Social History History  Substance Use Topics  . Smoking status: Current Every Day Smoker -- 0.50 packs/day    Types: Cigarettes    Start date: 12/31/1992  . Smokeless tobacco: Never Used  . Alcohol Use: Yes    No Known Allergies  Current Outpatient Prescriptions  Medication Sig Dispense Refill  . amLODipine-benazepril (LOTREL) 5-10 MG per capsule Take 1 capsule by mouth every morning.  30 capsule  2  . sertraline (ZOLOFT) 100 MG tablet Take 1 tablet (100 mg total) by mouth every morning.  30 tablet  0  . HYDROcodone-acetaminophen (NORCO) 10-325 MG per tablet Take 1 tablet by mouth every 6 (six) hours as needed. For pain  30 tablet  0  . hydrocortisone (ANUSOL-HC) 25 MG suppository Place 1 suppository (25 mg total) rectally 2 (two) times daily.  12 suppository  3  . promethazine (PHENERGAN) 25 MG tablet Take 25 mg by mouth every 6  (six) hours as needed. For nausea       No current facility-administered medications for this visit.    Review of Systems Review of Systems  Constitutional: Negative.   Gastrointestinal: Positive for abdominal pain and anal bleeding.    Blood pressure 124/72, pulse 80, resp. rate 16, height 5\' 9"  (1.753 m), weight 180 lb 3.2 oz (81.738 kg).  Physical Exam Physical Exam  Constitutional: He appears well-developed and well-nourished. No distress.  Genitourinary:  No external anal lesions. A digital rectal exam the prostate is smooth without nodules. No palpable masses.  Anoscopy demonstrates a very large inflamed right posterior hemorrhoid. There is a smaller moderate sized right anterior hemorrhoid. He was quite uncomfortable at this time so the anoscope was removed.    Data Reviewed Old colonoscopy report.  Assessment    Bleeding and prolapsing internal hemorrhoids. These are too large to be treated in the office. We discussed the options of internal hemorrhoidectomy as an outpatient versus attempted medical treatment. He is interested in proceeding with hemorrhoidectomy.     Plan    Hemorrhoidectomy. The procedure and risks were discussed with him the risks include but are not limited to bleeding, infection, wound problems, anesthesia, incontinence, recurrent hemorrhoidal disease. We also talked about the pain after surgery and activity restrictions. He seems to understand this. He is going on vacation and stated he would call us  back after he got back from his vacation if he wants proceed with the surgery.        Indigo Chaddock J 01/12/2013, 5:14 PM

## 2013-01-20 ENCOUNTER — Other Ambulatory Visit: Payer: Self-pay | Admitting: Family Medicine

## 2013-02-23 ENCOUNTER — Other Ambulatory Visit: Payer: Self-pay | Admitting: Nurse Practitioner

## 2013-02-25 NOTE — Telephone Encounter (Signed)
LAST OV 03/24/12. NTBS.

## 2013-04-05 ENCOUNTER — Other Ambulatory Visit: Payer: Self-pay | Admitting: Nurse Practitioner

## 2013-05-05 ENCOUNTER — Other Ambulatory Visit: Payer: Self-pay | Admitting: Family Medicine

## 2013-05-07 NOTE — Telephone Encounter (Signed)
Last seen 12/31/12  [name]

## 2013-05-14 ENCOUNTER — Other Ambulatory Visit: Payer: Self-pay

## 2013-07-23 ENCOUNTER — Other Ambulatory Visit: Payer: Self-pay | Admitting: Family Medicine

## 2013-08-25 ENCOUNTER — Encounter (HOSPITAL_COMMUNITY): Payer: Self-pay | Admitting: Emergency Medicine

## 2013-08-25 ENCOUNTER — Emergency Department (HOSPITAL_COMMUNITY)
Admission: EM | Admit: 2013-08-25 | Discharge: 2013-08-25 | Disposition: A | Discharge status: Home / Self Care | Payer: 59 | Attending: Emergency Medicine | Admitting: Emergency Medicine

## 2013-08-25 DIAGNOSIS — M545 Low back pain, unspecified: Secondary | ICD-10-CM

## 2013-08-25 DIAGNOSIS — I129 Hypertensive chronic kidney disease with stage 1 through stage 4 chronic kidney disease, or unspecified chronic kidney disease: Secondary | ICD-10-CM | POA: Insufficient documentation

## 2013-08-25 DIAGNOSIS — Z79899 Other long term (current) drug therapy: Secondary | ICD-10-CM | POA: Insufficient documentation

## 2013-08-25 DIAGNOSIS — F329 Major depressive disorder, single episode, unspecified: Secondary | ICD-10-CM | POA: Insufficient documentation

## 2013-08-25 DIAGNOSIS — F17200 Nicotine dependence, unspecified, uncomplicated: Secondary | ICD-10-CM | POA: Insufficient documentation

## 2013-08-25 DIAGNOSIS — F411 Generalized anxiety disorder: Secondary | ICD-10-CM | POA: Insufficient documentation

## 2013-08-25 DIAGNOSIS — F3289 Other specified depressive episodes: Secondary | ICD-10-CM | POA: Insufficient documentation

## 2013-08-25 DIAGNOSIS — N189 Chronic kidney disease, unspecified: Secondary | ICD-10-CM | POA: Insufficient documentation

## 2013-08-25 DIAGNOSIS — Z87442 Personal history of urinary calculi: Secondary | ICD-10-CM | POA: Insufficient documentation

## 2013-08-25 DIAGNOSIS — IMO0002 Reserved for concepts with insufficient information to code with codable children: Secondary | ICD-10-CM | POA: Insufficient documentation

## 2013-08-25 HISTORY — DX: Calculus of kidney: N20.0

## 2013-08-25 HISTORY — DX: Reserved for concepts with insufficient information to code with codable children: IMO0002

## 2013-08-25 MED ORDER — CYCLOBENZAPRINE HCL 10 MG PO TABS
10.0000 mg | ORAL_TABLET | Freq: Once | ORAL | Status: AC
  Administered 2013-08-25: 10 mg via ORAL
  Filled 2013-08-25: qty 1

## 2013-08-25 MED ORDER — OXYCODONE-ACETAMINOPHEN 5-325 MG PO TABS: 1.0000 | ORAL_TABLET | ORAL | Status: DC | PRN

## 2013-08-25 MED ORDER — PREDNISONE 10 MG PO TABS: ORAL_TABLET | ORAL | Status: DC

## 2013-08-25 MED ORDER — CYCLOBENZAPRINE HCL 10 MG PO TABS: 10.0000 mg | ORAL_TABLET | Freq: Three times a day (TID) | ORAL | Status: DC | PRN

## 2013-08-25 MED ORDER — OXYCODONE-ACETAMINOPHEN 5-325 MG PO TABS
2.0000 | ORAL_TABLET | Freq: Once | ORAL | Status: AC
  Administered 2013-08-25: 2 via ORAL
  Filled 2013-08-25: qty 2

## 2013-08-25 NOTE — Discharge Instructions (Signed)
Back Pain, Adult Back pain is very common. The pain often gets better over time. The cause of back pain is usually not dangerous. Most people can learn to manage their back pain on their own.  HOME CARE   Stay active. Start with short walks on flat ground if you can. Try to walk farther each day.  Do not sit, drive, or stand in one place for more than 30 minutes. Do not stay in bed.  Do not avoid exercise or work. Activity can help your back heal faster.  Be careful when you bend or lift an object. Bend at your knees, keep the object close to you, and do not twist.  Sleep on a firm mattress. Lie on your side, and bend your knees. If you lie on your back, put a pillow under your knees.  Only take medicines as told by your doctor.  Put ice on the injured area.  Put ice in a plastic bag.  Place a towel between your skin and the bag.  Leave the ice on for 15-20 minutes, 03-04 times a day for the first 2 to 3 days. After that, you can switch between ice and heat packs.  Ask your doctor about back exercises or massage.  Avoid feeling anxious or stressed. Find good ways to deal with stress, such as exercise. GET HELP RIGHT AWAY IF:   Your pain does not go away with rest or medicine.  Your pain does not go away in 1 week.  You have new problems.  You do not feel well.  The pain spreads into your legs.  You cannot control when you poop (bowel movement) or pee (urinate).  Your arms or legs feel weak or lose feeling (numbness).  You feel sick to your stomach (nauseous) or throw up (vomit).  You have belly (abdominal) pain.  You feel like you may pass out (faint). MAKE SURE YOU:   Understand these instructions.  Will watch your condition.  Will get help right away if you are not doing well or get worse. Document Released: 12/12/2007 Document Revised: 09/17/2011 Document Reviewed: 11/13/2010 [facility] Patient Information 2014 [place].  Lumbosacral  Strain Lumbosacral strain is a strain of any of the parts that make up your lumbosacral vertebrae. Your lumbosacral vertebrae are the bones that make up the lower third of your backbone. Your lumbosacral vertebrae are held together by muscles and tough, fibrous tissue (ligaments).  CAUSES  A sudden blow to your back can cause lumbosacral strain. Also, anything that causes an excessive stretch of the muscles in the low back can cause this strain. This is typically seen when people exert themselves strenuously, fall, lift heavy objects, bend, or crouch repeatedly. RISK FACTORS  Physically demanding work.  Participation in pushing or pulling sports or sports that require sudden twist of the back (tennis, golf, baseball).  Weight lifting.  Excessive lower back curvature.  Forward-tilted pelvis.  Weak back or abdominal muscles or both.  Tight hamstrings. SIGNS AND SYMPTOMS  Lumbosacral strain may cause pain in the area of your injury or pain that moves (radiates) down your leg.  DIAGNOSIS Your health care provider can often diagnose lumbosacral strain through a physical exam. In some cases, you may need tests such as X-ray exams.  TREATMENT  Treatment for your lower back injury depends on many factors that your clinician will have to evaluate. However, most treatment will include the use of anti-inflammatory medicines. HOME CARE INSTRUCTIONS   Avoid hard physical activities (tennis,  racquetball, waterskiing) if you are not in proper physical condition for it. This may aggravate or create problems.  If you have a back problem, avoid sports requiring sudden body movements. Swimming and walking are generally safer activities.  Maintain good posture.  Maintain a healthy weight.  For acute conditions, you may put ice on the injured area.  Put ice in a plastic bag.  Place a towel between your skin and the bag.  Leave the ice on for 20 minutes, 2 3 times a day.  When the low back  starts healing, stretching and strengthening exercises may be recommended. SEEK MEDICAL CARE IF:  Your back pain is getting worse.  You experience severe back pain not relieved with medicines. SEEK IMMEDIATE MEDICAL CARE IF:   You have numbness, tingling, weakness, or problems with the use of your arms or legs.  There is a change in bowel or bladder control.  You have increasing pain in any area of the body, including your belly (abdomen).  You notice shortness of breath, dizziness, or feel faint.  You feel sick to your stomach (nauseous), are throwing up (vomiting), or become sweaty.  You notice discoloration of your toes or legs, or your feet get very cold. MAKE SURE YOU:   Understand these instructions.  Will watch your condition.  Will get help right away if you are not doing well or get worse. Document Released: 04/04/2005 Document Revised: 04/15/2013 Document Reviewed: 02/11/2013 [facility] Patient Information 2014 [place], [place].

## 2013-08-25 NOTE — ED Provider Notes (Signed)
CSN: [id number]     Arrival date & time 08/25/13  1831 History   First MD Initiated Contact with Patient 08/25/13 1843     Chief Complaint  Patient presents with  . Back Pain     (Consider location/radiation/quality/duration/timing/severity/associated sxs/prior Treatment) Patient is a 48 y.o. male presenting with back pain. The history is provided by the patient.  Back Pain Location:  Lumbar spine Quality:  Aching Radiates to:  Does not radiate Pain severity:  Moderate Pain is:  Same all the time Onset quality:  Sudden Duration:  1 day Timing:  Constant Progression:  Unchanged Chronicity:  New Context: lifting heavy objects and twisting   Relieved by:  Nothing Worsened by:  Bending, twisting and sitting Ineffective treatments:  Narcotics and NSAIDs Associated symptoms: no abdominal pain, no abdominal swelling, no bladder incontinence, no bowel incontinence, no chest pain, no dysuria, no fever, no headaches, no leg pain, no numbness, no paresthesias, no pelvic pain, no perianal numbness, no tingling and no weakness     Past Medical History  Diagnosis Date  . Hypertension   . Depression   . Anxiety   . Chronic kidney disease     kidney stones  . Bulging discs   . Kidney stones    Past Surgical History  Procedure Laterality Date  . Lithotripsy    . Cystoscopy w/ ureteral stent placement  09-2011  . Cystoscopy w/ ureteral stent removal  10-2011   Family History  Problem Relation Age of Onset  . ALS Father    History  Substance Use Topics  . Smoking status: Current Every Day Smoker -- 0.50 packs/day for 20 years    Types: Cigarettes    Start date: 12/31/1992  . Smokeless tobacco: Never Used  . Alcohol Use: Yes     Comment: occasional    Review of Systems  Constitutional: Negative for fever.  Respiratory: Negative for shortness of breath.   Cardiovascular: Negative for chest pain.  Gastrointestinal: Negative for vomiting, abdominal pain, constipation and bowel  incontinence.  Genitourinary: Negative for bladder incontinence, dysuria, hematuria, flank pain, decreased urine volume, difficulty urinating and pelvic pain.       No perineal numbness or incontinence of urine or feces  Musculoskeletal: Positive for back pain. Negative for joint swelling.  Skin: Negative for rash.  Neurological: Negative for tingling, weakness, numbness, headaches and paresthesias.  All other systems reviewed and are negative.      Allergies  Review of patient's allergies indicates no known allergies.  Home Medications   Current Outpatient Rx  Name  Route  Sig  Dispense  Refill  . amLODipine-benazepril (LOTREL) 5-10 MG per capsule      TAKE 1 CAPSULE BY MOUTH EVERY MORNING.   30 capsule   2   . HYDROcodone-acetaminophen (NORCO) 10-325 MG per tablet   Oral   Take 1 tablet by mouth every 6 (six) hours as needed. For pain   30 tablet   0   . hydrocortisone (ANUSOL-HC) 25 MG suppository   Rectal   Place 1 suppository (25 mg total) rectally 2 (two) times daily.   12 suppository   3   . promethazine (PHENERGAN) 25 MG tablet   Oral   Take 25 mg by mouth every 6 (six) hours as needed. For nausea         . sertraline (ZOLOFT) 100 MG tablet      TAKE 1 TABLET (100 MG TOTAL) BY MOUTH EVERY MORNING.   30 tablet  3    BP 154/82  Pulse 79  Temp(Src) 97.9 F (36.6 C) (Oral)  Resp 20  Ht 5\' 11"  (1.803 m)  Wt 184 lb (83.462 kg)  BMI 25.67 kg/m2  SpO2 100% Physical Exam  Nursing note and vitals reviewed. Constitutional: He is oriented to person, place, and time. He appears well-developed and well-nourished. No distress.  HENT:  Head: Normocephalic and atraumatic.  Neck: Normal range of motion. Neck supple.  Cardiovascular: Normal rate, regular rhythm, normal heart sounds and intact distal pulses.   No murmur heard. Pulmonary/Chest: Effort normal and breath sounds normal. No respiratory distress.  Abdominal: Soft. He exhibits no distension. There is  no tenderness.  Musculoskeletal: He exhibits tenderness. He exhibits no edema.       Lumbar back: He exhibits tenderness and pain. He exhibits normal range of motion, no swelling, no deformity, no laceration and normal pulse.       Back:  ttp of the lumbar spine and paraspinal muscles.    DP pulses are brisk and symmetrical.  Distal sensation intact.  Hip Flexors/Extensors are intact  Neurological: He is alert and oriented to person, place, and time. He has normal strength. No sensory deficit. He exhibits normal muscle tone. Coordination and gait normal.  Reflex Scores:      Patellar reflexes are 2+ on the right side and 2+ on the left side.      Achilles reflexes are 2+ on the right side and 2+ on the left side. Skin: Skin is warm and dry. No rash noted.    ED Course  Procedures (including critical care time) Labs Review Labs Reviewed - No data to display Imaging Review No results found.  EKG Interpretation   None       MDM   Final diagnoses:  Low back pain   No focal neuro deficits on exam.  Ambulates with a slow but steady gait.  No concerning sx's for emergent neurological or infectious process.  Pt agrees to close f/u with his PMD for recheck this week if pain is not improving.  VSS.    Pain improved after medications.       Patient reviewed on the [place]. No recent narcotic prescriptions filled.  The patient appears reasonably screened and/or stabilized for discharge and I doubt any other medical condition or other [facility] requiring further screening, evaluation, or treatment in the ED at this time prior to discharge.   Dealie Koelzer L. Vanessa Kirtland, PA-C 08/26/13 2006

## 2013-08-25 NOTE — ED Notes (Signed)
Per patient lifting fire wood yesterday and "back went out." Patient c/o lower back pain. Per patient hx of bulging discs. Patient denies taking any medication for pain. Per patient went to urgent care today but they were closed due to the weather.

## 2013-08-28 NOTE — ED Provider Notes (Signed)
Medical screening examination/treatment/procedure(s) were performed by non-physician practitioner and as supervising physician I was immediately available for consultation/collaboration.  EKG Interpretation   None         Sharyon Cable, MD 08/28/13 1015

## 2013-09-01 ENCOUNTER — Ambulatory Visit (INDEPENDENT_AMBULATORY_CARE_PROVIDER_SITE_OTHER): Payer: 59 | Admitting: Family Medicine

## 2013-09-01 ENCOUNTER — Encounter: Payer: Self-pay | Admitting: Family Medicine

## 2013-09-01 VITALS — BP 152/100 | HR 91 | Temp 98.2°F | Ht 69.0 in | Wt 192.6 lb

## 2013-09-01 DIAGNOSIS — K645 Perianal venous thrombosis: Secondary | ICD-10-CM

## 2013-09-01 MED ORDER — PRAMOXINE HCL 1 % RE FOAM: 1.0000 "application " | Freq: Three times a day (TID) | RECTAL | Status: DC | PRN

## 2013-09-01 MED ORDER — HYDROCODONE-ACETAMINOPHEN 7.5-325 MG PO TABS: 1.0000 | ORAL_TABLET | Freq: Four times a day (QID) | ORAL | Status: DC | PRN

## 2013-09-01 NOTE — Progress Notes (Signed)
   Subjective:    Patient ID: TATUM MASSMAN, male    DOB: 12/03/1965, 48 y.o.   MRN: [id number]  HPI  This 48 y.o. male presents for evaluation of severe rectal pain starting today.  He states he has Such severe pain he is going to pass out and has difficulty walking.  He states this got worse Over last few hours.  Review of Systems C/o severe rectal pain No chest pain, SOB, HA, dizziness, vision change, N/V, diarrhea, constipation, dysuria, urinary urgency or frequency, myalgias, arthralgias or rash.     Objective:   Physical Exam Vital signs noted  Recumbant male in severe rectal pain.  HEENT - Head atraumatic Normocephalic                Eyes - PERRLA, Conjuctiva - clear Sclera- Clear EOMI                Ears - EAC's Wnl TM's Wnl Gross Hearing WNL                Throat - oropharanx wnl Respiratory - Lungs CTA bilateral Cardiac - RRR S1 and S2 without murmur GI - Abdomen soft Nontender and bowel sounds active x 4 Rectal - Large thrombosed hemorrhoids.       Assessment & Plan:  Thrombosed hemorrhoids - Plan: Ambulatory referral to General Surgery, pramoxine (PROCTOFOAM) 1 % foam, HYDROcodone-acetaminophen (NORCO) 7.5-325 MG per tablet  Lysbeth Penner FNP

## 2013-09-01 NOTE — Patient Instructions (Signed)
Hemorrhoids Hemorrhoids are swollen veins around the rectum or anus. There are two types of hemorrhoids:   Internal hemorrhoids. These occur in the veins just inside the rectum. They may poke through to the outside and become irritated and painful.  External hemorrhoids. These occur in the veins outside the anus and can be felt as a painful swelling or hard lump near the anus. CAUSES  Pregnancy.   Obesity.   Constipation or diarrhea.   Straining to have a bowel movement.   Sitting for long periods on the toilet.  Heavy lifting or other activity that caused you to strain.  Anal intercourse. SYMPTOMS   Pain.   Anal itching or irritation.   Rectal bleeding.   Fecal leakage.   Anal swelling.   One or more lumps around the anus.  DIAGNOSIS  Your caregiver may be able to diagnose hemorrhoids by visual examination. Other examinations or tests that may be performed include:   Examination of the rectal area with a gloved hand (digital rectal exam).   Examination of anal canal using a small tube (scope).   A blood test if you have lost a significant amount of blood.  A test to look inside the colon (sigmoidoscopy or colonoscopy). TREATMENT Most hemorrhoids can be treated at home. However, if symptoms do not seem to be getting better or if you have a lot of rectal bleeding, your caregiver may perform a procedure to help make the hemorrhoids get smaller or remove them completely. Possible treatments include:   Placing a rubber band at the base of the hemorrhoid to cut off the circulation (rubber band ligation).   Injecting a chemical to shrink the hemorrhoid (sclerotherapy).   Using a tool to burn the hemorrhoid (infrared light therapy).   Surgically removing the hemorrhoid (hemorrhoidectomy).   Stapling the hemorrhoid to block blood flow to the tissue (hemorrhoid stapling).  HOME CARE INSTRUCTIONS   Eat foods with fiber, such as whole grains, beans,  nuts, fruits, and vegetables. Ask your doctor about taking products with added fiber in them (fibersupplements).  Increase fluid intake. Drink enough water and fluids to keep your urine clear or pale yellow.   Exercise regularly.   Go to the bathroom when you have the urge to have a bowel movement. Do not wait.   Avoid straining to have bowel movements.   Keep the anal area dry and clean. Use wet toilet paper or moist towelettes after a bowel movement.   Medicated creams and suppositories may be used or applied as directed.   Only take over-the-counter or prescription medicines as directed by your caregiver.   Take warm sitz baths for 15 20 minutes, 3 4 times a day to ease pain and discomfort.   Place ice packs on the hemorrhoids if they are tender and swollen. Using ice packs between sitz baths may be helpful.   Put ice in a plastic bag.   Place a towel between your skin and the bag.   Leave the ice on for 15 20 minutes, 3 4 times a day.   Do not use a donut-shaped pillow or sit on the toilet for long periods. This increases blood pooling and pain.  SEEK MEDICAL CARE IF:  You have increasing pain and swelling that is not controlled by treatment or medicine.  You have uncontrolled bleeding.  You have difficulty or you are unable to have a bowel movement.  You have pain or inflammation outside the area of the hemorrhoids. MAKE SURE YOU:    Understand these instructions.  Will watch your condition.  Will get help right away if you are not doing well or get worse. Document Released: 06/22/2000 Document Revised: 06/11/2012 Document Reviewed: 04/29/2012 [employer] Patient Information 2014 [employer].  

## 2013-09-02 ENCOUNTER — Inpatient Hospital Stay (HOSPITAL_COMMUNITY)
Admission: AD | Admit: 2013-09-02 | Discharge: 2013-09-04 | DRG: 349 | Disposition: A | Discharge status: Home / Self Care | Payer: 59 | Source: Ambulatory Visit | Attending: General Surgery | Admitting: General Surgery

## 2013-09-02 ENCOUNTER — Encounter (INDEPENDENT_AMBULATORY_CARE_PROVIDER_SITE_OTHER): Payer: Self-pay | Admitting: General Surgery

## 2013-09-02 ENCOUNTER — Ambulatory Visit (INDEPENDENT_AMBULATORY_CARE_PROVIDER_SITE_OTHER): Payer: 59 | Admitting: General Surgery

## 2013-09-02 ENCOUNTER — Encounter (HOSPITAL_COMMUNITY): Payer: Self-pay | Admitting: *Deleted

## 2013-09-02 ENCOUNTER — Other Ambulatory Visit (INDEPENDENT_AMBULATORY_CARE_PROVIDER_SITE_OTHER): Payer: Self-pay | Admitting: General Surgery

## 2013-09-02 VITALS — BP 135/90 | HR 88 | Temp 98.2°F | Resp 16 | Ht 70.5 in | Wt 188.8 lb

## 2013-09-02 DIAGNOSIS — K645 Perianal venous thrombosis: Secondary | ICD-10-CM

## 2013-09-02 DIAGNOSIS — K648 Other hemorrhoids: Principal | ICD-10-CM | POA: Diagnosis present

## 2013-09-02 LAB — CBC
HCT: 42.7 % (ref 39.0–52.0)
Hemoglobin: 14.7 g/dL (ref 13.0–17.0)
MCH: 29.5 pg (ref 26.0–34.0)
MCHC: 34.4 g/dL (ref 30.0–36.0)
MCV: 85.6 fL (ref 78.0–100.0)
PLATELETS: 249 10*3/uL (ref 150–400)
RBC: 4.99 MIL/uL (ref 4.22–5.81)
RDW: 13.4 % (ref 11.5–15.5)
WBC: 12.6 10*3/uL — ABNORMAL HIGH (ref 4.0–10.5)

## 2013-09-02 LAB — COMPREHENSIVE METABOLIC PANEL
ALBUMIN: 3.4 g/dL — AB (ref 3.5–5.2)
ALT: 14 U/L (ref 0–53)
AST: 12 U/L (ref 0–37)
Alkaline Phosphatase: 91 U/L (ref 39–117)
BUN: 9 mg/dL (ref 6–23)
CO2: 26 mEq/L (ref 19–32)
CREATININE: 0.9 mg/dL (ref 0.50–1.35)
Calcium: 9.3 mg/dL (ref 8.4–10.5)
Chloride: 100 mEq/L (ref 96–112)
GFR calc Af Amer: >90 mL/min (ref >90)
GFR calc non Af Amer: >90 mL/min (ref >90)
Glucose, Bld: 111 mg/dL — ABNORMAL HIGH (ref 70–99)
Potassium: 3.7 mEq/L (ref 3.7–5.3)
Sodium: 138 mEq/L (ref 137–147)
TOTAL PROTEIN: 6.7 g/dL (ref 6.0–8.3)
Total Bilirubin: 0.3 mg/dL (ref 0.3–1.2)

## 2013-09-02 MED ORDER — ONDANSETRON HCL 4 MG/2ML IJ SOLN: 4.0000 mg | Freq: Four times a day (QID) | INTRAMUSCULAR | Status: DC | PRN

## 2013-09-02 MED ORDER — LIDOCAINE 5 % EX OINT
TOPICAL_OINTMENT | CUTANEOUS | Status: DC | PRN
  Filled 2013-09-02: qty 35.44

## 2013-09-02 MED ORDER — POTASSIUM CHLORIDE IN NACL 20-0.9 MEQ/L-% IV SOLN
INTRAVENOUS | Status: DC
  Administered 2013-09-02 – 2013-09-03 (×2): via INTRAVENOUS
  Filled 2013-09-02 (×2): qty 1000

## 2013-09-02 MED ORDER — SODIUM CHLORIDE 0.9 % IV SOLN
1.0000 g | INTRAVENOUS | Status: DC
  Administered 2013-09-02: 1 g via INTRAVENOUS
  Filled 2013-09-02 (×2): qty 1

## 2013-09-02 MED ORDER — MORPHINE SULFATE 2 MG/ML IJ SOLN
2.0000 mg | INTRAMUSCULAR | Status: DC | PRN
  Administered 2013-09-02 – 2013-09-03 (×5): 6 mg via INTRAVENOUS
  Filled 2013-09-02 (×5): qty 3

## 2013-09-02 MED ORDER — INFLUENZA VAC SPLIT QUAD 0.5 ML IM SUSP
0.5000 mL | INTRAMUSCULAR | Status: DC
  Filled 2013-09-02 (×2): qty 0.5

## 2013-09-02 NOTE — Progress Notes (Signed)
  Admission history and physical Subjective:   severe rectal pain  Patient ID: [name] [name], male   DOB: 1966/05/05, 48 y.o.   MRN: [id number]  HPI Patient is a generally healthy 48 year old male who has some previous history of prolapsing internal hemorrhoids with bleeding, never requiring surgery. 2 days ago he developed fairly sudden onset of swelling and severe pain in his rectum. The pain has continued, quite severe, 10 over 10 described as worse than his previous kidney stones. He has had blood tinged watery drainage but no overt bleeding. There is a lot of swelling.  Past Medical History  Diagnosis Date  . Hypertension   . Depression   . Anxiety   . Chronic kidney disease     kidney stones  . Bulging discs   . Kidney stones    Past Surgical History  Procedure Laterality Date  . Lithotripsy    . Cystoscopy w/ ureteral stent placement  09-2011  . Cystoscopy w/ ureteral stent removal  10-2011   Current Outpatient Prescriptions  Medication Sig Dispense Refill  . amLODipine-benazepril (LOTREL) 5-10 MG per capsule TAKE 1 CAPSULE BY MOUTH EVERY MORNING.  30 capsule  2  . cyclobenzaprine (FLEXERIL) 10 MG tablet Take 1 tablet (10 mg total) by mouth 3 (three) times daily as needed.  21 tablet  0  . HYDROcodone-acetaminophen (NORCO) 7.5-325 MG per tablet Take 1 tablet by mouth every 6 (six) hours as needed for moderate pain.  30 tablet  0  . oxyCODONE-acetaminophen (PERCOCET/ROXICET) 5-325 MG per tablet Take 1 tablet by mouth every 4 (four) hours as needed for severe pain.  20 tablet  0  . pramoxine (PROCTOFOAM) 1 % foam Place 1 application rectally 3 (three) times daily as needed for itching.  15 g  0  . sertraline (ZOLOFT) 100 MG tablet TAKE 1 TABLET (100 MG TOTAL) BY MOUTH EVERY MORNING.  30 tablet  3  . tamsulosin (FLOMAX) 0.4 MG CAPS capsule Take 0.4 mg by mouth daily.       No current facility-administered medications for this visit.   No Known Allergies History  Substance Use  Topics  . Smoking status: Current Every Day Smoker -- 0.50 packs/day for 20 years    Types: Cigarettes    Start date: 12/31/1992  . Smokeless tobacco: Never Used  . Alcohol Use: Yes     Comment: occasional     Review of Systems  Constitutional: Negative for fever and chills.  Respiratory: Negative.   Cardiovascular: Negative.        Objective:   Physical Exam BP 135/90  Pulse 88  Temp(Src) 98.2 F (36.8 C) (Temporal)  Resp 16  Ht 5' 10.5" (1.791 m)  Wt 188 lb 12.8 oz (85.639 kg)  BMI 26.70 kg/m2 General: Well-developed Caucasian male in obvious pain Skin: No rash or infection HEENT: No masses. Sclera nonicteric Lungs: Clear breath sounds bilaterally Cardiac: Regular rate and rhythm no murmurs Abdomen: Soft and nontender Rectal: There are large circumferential prolapsed and gangrenous internal hemorrhoids with exquisite tenderness and some edema and swelling of external hemorrhoids and perianal skin. Neurologic: Alert and oriented. Gait normal.     Assessment:     Circumferential prolapsed gangrenous internal hemorrhoids. Patient will require emergency formal hemorrhoidectomy. He will be admitted to the hospital today and made n.p.o., local care and IV antibiotics and plan urgent hemorrhoidectomy.     Plan:     As above

## 2013-09-03 ENCOUNTER — Encounter (HOSPITAL_COMMUNITY): Payer: 59 | Admitting: Anesthesiology

## 2013-09-03 ENCOUNTER — Inpatient Hospital Stay (HOSPITAL_COMMUNITY): Payer: 59 | Admitting: Anesthesiology

## 2013-09-03 ENCOUNTER — Inpatient Hospital Stay (HOSPITAL_COMMUNITY): Payer: 59

## 2013-09-03 ENCOUNTER — Encounter (HOSPITAL_COMMUNITY): Admission: AD | Disposition: A | Discharge status: Home / Self Care | Payer: Self-pay | Source: Ambulatory Visit

## 2013-09-03 DIAGNOSIS — K648 Other hemorrhoids: Secondary | ICD-10-CM

## 2013-09-03 HISTORY — PX: HEMORRHOID SURGERY: SHX153

## 2013-09-03 LAB — URINALYSIS, DIPSTICK ONLY
BILIRUBIN URINE: NEGATIVE
GLUCOSE, UA: NEGATIVE mg/dL
Hgb urine dipstick: NEGATIVE
KETONES UR: NEGATIVE mg/dL
Nitrite: NEGATIVE
PH: 6 (ref 5.0–8.0)
Protein, ur: NEGATIVE mg/dL
Specific Gravity, Urine: 1.006 (ref 1.005–1.030)
Urobilinogen, UA: 0.2 mg/dL (ref 0.0–1.0)

## 2013-09-03 SURGERY — HEMORRHOIDECTOMY
Anesthesia: General | Site: Rectum

## 2013-09-03 MED ORDER — MORPHINE SULFATE 10 MG/ML IJ SOLN
INTRAMUSCULAR | Status: AC
  Filled 2013-09-03: qty 1

## 2013-09-03 MED ORDER — 0.9 % SODIUM CHLORIDE (POUR BTL) OPTIME
TOPICAL | Status: DC | PRN
  Administered 2013-09-03: 1000 mL

## 2013-09-03 MED ORDER — OXYCODONE-ACETAMINOPHEN 5-325 MG PO TABS
1.0000 | ORAL_TABLET | ORAL | Status: DC | PRN
  Administered 2013-09-03 – 2013-09-04 (×3): 1 via ORAL
  Filled 2013-09-03 (×3): qty 1

## 2013-09-03 MED ORDER — SERTRALINE HCL 25 MG PO TABS
25.0000 mg | ORAL_TABLET | Freq: Every day | ORAL | Status: DC
  Administered 2013-09-03 – 2013-09-04 (×2): 25 mg via ORAL
  Filled 2013-09-03 (×2): qty 1

## 2013-09-03 MED ORDER — ONDANSETRON HCL 4 MG/2ML IJ SOLN
INTRAMUSCULAR | Status: AC
  Filled 2013-09-03: qty 2

## 2013-09-03 MED ORDER — AMLODIPINE BESYLATE 5 MG PO TABS
5.0000 mg | ORAL_TABLET | Freq: Every day | ORAL | Status: DC
  Administered 2013-09-03 – 2013-09-04 (×2): 5 mg via ORAL
  Filled 2013-09-03 (×2): qty 1

## 2013-09-03 MED ORDER — ONDANSETRON HCL 4 MG/2ML IJ SOLN
INTRAMUSCULAR | Status: DC | PRN
  Administered 2013-09-03: 4 mg via INTRAVENOUS

## 2013-09-03 MED ORDER — DOCUSATE SODIUM 100 MG PO CAPS
100.0000 mg | ORAL_CAPSULE | Freq: Three times a day (TID) | ORAL | Status: DC
  Administered 2013-09-03 – 2013-09-04 (×3): 100 mg via ORAL
  Filled 2013-09-03 (×5): qty 1

## 2013-09-03 MED ORDER — FENTANYL CITRATE 0.05 MG/ML IJ SOLN
INTRAMUSCULAR | Status: DC | PRN
  Administered 2013-09-03 (×5): 50 ug via INTRAVENOUS

## 2013-09-03 MED ORDER — EPHEDRINE SULFATE 50 MG/ML IJ SOLN
INTRAMUSCULAR | Status: DC | PRN
  Administered 2013-09-03: 5 mg via INTRAVENOUS

## 2013-09-03 MED ORDER — CALCIUM POLYCARBOPHIL 625 MG PO TABS
1250.0000 mg | ORAL_TABLET | Freq: Two times a day (BID) | ORAL | Status: DC
  Administered 2013-09-03 – 2013-09-04 (×3): 1250 mg via ORAL
  Filled 2013-09-03 (×4): qty 2

## 2013-09-03 MED ORDER — AMLODIPINE BESY-BENAZEPRIL HCL 5-10 MG PO CAPS: 1.0000 | ORAL_CAPSULE | Freq: Every day | ORAL | Status: DC

## 2013-09-03 MED ORDER — SODIUM CHLORIDE 0.9 % IJ SOLN
INTRAMUSCULAR | Status: AC
  Filled 2013-09-03: qty 50

## 2013-09-03 MED ORDER — LIDOCAINE HCL (CARDIAC) 20 MG/ML IV SOLN
INTRAVENOUS | Status: AC
  Filled 2013-09-03: qty 5

## 2013-09-03 MED ORDER — SODIUM CHLORIDE 0.9 % IJ SOLN
INTRAMUSCULAR | Status: DC | PRN
  Administered 2013-09-03: 20 mL

## 2013-09-03 MED ORDER — EPHEDRINE SULFATE 50 MG/ML IJ SOLN
INTRAMUSCULAR | Status: AC
  Filled 2013-09-03: qty 1

## 2013-09-03 MED ORDER — TAMSULOSIN HCL 0.4 MG PO CAPS
0.4000 mg | ORAL_CAPSULE | Freq: Every day | ORAL | Status: DC
  Administered 2013-09-03 – 2013-09-04 (×2): 0.4 mg via ORAL
  Filled 2013-09-03 (×2): qty 1

## 2013-09-03 MED ORDER — MIDAZOLAM HCL 2 MG/2ML IJ SOLN
INTRAMUSCULAR | Status: AC
  Filled 2013-09-03: qty 2

## 2013-09-03 MED ORDER — LACTATED RINGERS IV SOLN
INTRAVENOUS | Status: DC | PRN
  Administered 2013-09-03: 12:00:00
  Administered 2013-09-03: 09:00:00 via INTRAVENOUS

## 2013-09-03 MED ORDER — DIBUCAINE 1 % RE OINT
TOPICAL_OINTMENT | RECTAL | Status: AC
  Filled 2013-09-03: qty 28

## 2013-09-03 MED ORDER — PROPOFOL 10 MG/ML IV BOLUS
INTRAVENOUS | Status: AC
  Filled 2013-09-03: qty 20

## 2013-09-03 MED ORDER — BUPIVACAINE LIPOSOME 1.3 % IJ SUSP
20.0000 mL | Freq: Once | INTRAMUSCULAR | Status: DC
  Filled 2013-09-03: qty 20

## 2013-09-03 MED ORDER — PROMETHAZINE HCL 25 MG/ML IJ SOLN: 6.2500 mg | INTRAMUSCULAR | Status: DC | PRN

## 2013-09-03 MED ORDER — SUCCINYLCHOLINE CHLORIDE 20 MG/ML IJ SOLN
INTRAMUSCULAR | Status: DC | PRN
  Administered 2013-09-03: 140 mg via INTRAVENOUS

## 2013-09-03 MED ORDER — BENAZEPRIL HCL 10 MG PO TABS
10.0000 mg | ORAL_TABLET | Freq: Every day | ORAL | Status: DC
  Administered 2013-09-03 – 2013-09-04 (×2): 10 mg via ORAL
  Filled 2013-09-03 (×2): qty 1

## 2013-09-03 MED ORDER — PROPOFOL 10 MG/ML IV BOLUS
INTRAVENOUS | Status: DC | PRN
  Administered 2013-09-03: 200 mg via INTRAVENOUS

## 2013-09-03 MED ORDER — FENTANYL CITRATE 0.05 MG/ML IJ SOLN
INTRAMUSCULAR | Status: AC
  Filled 2013-09-03: qty 5

## 2013-09-03 MED ORDER — MIDAZOLAM HCL 5 MG/5ML IJ SOLN
INTRAMUSCULAR | Status: DC | PRN
  Administered 2013-09-03: 2 mg via INTRAVENOUS

## 2013-09-03 MED ORDER — DIAZEPAM 5 MG PO TABS: 5.0000 mg | ORAL_TABLET | Freq: Four times a day (QID) | ORAL | Status: DC | PRN

## 2013-09-03 MED ORDER — SODIUM CHLORIDE 0.9 % IJ SOLN
INTRAMUSCULAR | Status: AC
  Filled 2013-09-03: qty 10

## 2013-09-03 MED ORDER — LIDOCAINE HCL (CARDIAC) 20 MG/ML IV SOLN
INTRAVENOUS | Status: DC | PRN
  Administered 2013-09-03: 80 mg via INTRAVENOUS

## 2013-09-03 MED ORDER — BUPIVACAINE-EPINEPHRINE PF 0.25-1:200000 % IJ SOLN
INTRAMUSCULAR | Status: AC
  Filled 2013-09-03: qty 30

## 2013-09-03 MED ORDER — BUPIVACAINE LIPOSOME 1.3 % IJ SUSP
INTRAMUSCULAR | Status: DC | PRN
  Administered 2013-09-03: 20 mL

## 2013-09-03 MED ORDER — MORPHINE SULFATE 2 MG/ML IJ SOLN
1.0000 mg | INTRAMUSCULAR | Status: DC | PRN
  Administered 2013-09-03 (×2): 2 mg via INTRAVENOUS

## 2013-09-03 SURGICAL SUPPLY — 35 items
BLADE SURG 15 STRL LF DISP TIS (BLADE) ×1 IMPLANT
BLADE SURG 15 STRL SS (BLADE) ×3
BLADE SURG SZ10 CARB STEEL (BLADE) ×2 IMPLANT
BRIEF STRETCH FOR OB PAD LRG (UNDERPADS AND DIAPERS) ×3 IMPLANT
CANISTER SUCTION 2500CC (MISCELLANEOUS) ×3 IMPLANT
COVER SURGICAL LIGHT HANDLE (MISCELLANEOUS) ×3 IMPLANT
DECANTER SPIKE VIAL GLASS SM (MISCELLANEOUS) ×3 IMPLANT
DRAPE LAPAROTOMY T 102X78X121 (DRAPES) ×3 IMPLANT
DRAPE LG THREE QUARTER DISP (DRAPES) IMPLANT
ELECT REM PT RETURN 9FT ADLT (ELECTROSURGICAL) ×3
ELECTRODE REM PT RTRN 9FT ADLT (ELECTROSURGICAL) ×1 IMPLANT
GAUZE SPONGE 4X4 16PLY XRAY LF (GAUZE/BANDAGES/DRESSINGS) ×3 IMPLANT
GLOVE BIOGEL PI IND STRL 7.0 (GLOVE) ×1 IMPLANT
GLOVE BIOGEL PI INDICATOR 7.0 (GLOVE) ×2
GOWN STRL REUS W/TWL XL LVL3 (GOWN DISPOSABLE) ×3 IMPLANT
KIT BASIN OR (CUSTOM PROCEDURE TRAY) ×3 IMPLANT
LUBRICANT JELLY K Y 4OZ (MISCELLANEOUS) ×3 IMPLANT
NDL HYPO 25X1 1.5 SAFETY (NEEDLE) ×1 IMPLANT
NDL SAFETY ECLIPSE 18X1.5 (NEEDLE) IMPLANT
NEEDLE HYPO 18GX1.5 SHARP (NEEDLE)
NEEDLE HYPO 25X1 1.5 SAFETY (NEEDLE) ×3 IMPLANT
NS IRRIG 1000ML POUR BTL (IV SOLUTION) ×3 IMPLANT
PACK BASIC VI WITH GOWN DISP (CUSTOM PROCEDURE TRAY) ×3 IMPLANT
PENCIL BUTTON HOLSTER BLD 10FT (ELECTRODE) ×3 IMPLANT
SHEARS HARMONIC 9CM CVD (BLADE) IMPLANT
SPONGE GAUZE 4X4 12PLY (GAUZE/BANDAGES/DRESSINGS) ×2 IMPLANT
SPONGE SURGIFOAM ABS GEL 100 (HEMOSTASIS) IMPLANT
SUT CHROMIC 2 0 SH (SUTURE) ×4 IMPLANT
SUT CHROMIC 3 0 SH 27 (SUTURE) ×6 IMPLANT
SYR BULB IRRIGATION 50ML (SYRINGE) ×2 IMPLANT
SYR CONTROL 10ML LL (SYRINGE) ×6 IMPLANT
TAPE CLOTH SURG 6X10 WHT LF (GAUZE/BANDAGES/DRESSINGS) ×2 IMPLANT
TOWEL OR 17X26 10 PK STRL BLUE (TOWEL DISPOSABLE) ×3 IMPLANT
TRAY FOLEY CATH 16FR SILVER (SET/KITS/TRAYS/PACK) ×2 IMPLANT
YANKAUER SUCT BULB TIP 10FT TU (MISCELLANEOUS) ×3 IMPLANT

## 2013-09-03 NOTE — Transfer of Care (Signed)
Immediate Anesthesia Transfer of Care Note  Patient: [name] [name]  Procedure(s) Performed: Procedure(s): HEMORRHOIDECTOMY (N/A)  Patient Location: PACU  Anesthesia Type:General  Level of Consciousness: awake, alert  and oriented  Airway & Oxygen Therapy: Patient Spontanous Breathing and Patient connected to face mask oxygen  Post-op Assessment: Report given to PACU RN and Post -op Vital signs reviewed and stable  Post vital signs: Reviewed and stable  Complications: No apparent anesthesia complications

## 2013-09-03 NOTE — Op Note (Signed)
09/02/2013 - 09/03/2013  11:54 AM  PATIENT:  [name] [name]  48 y.o. male  Patient Care Team: Chipper Herb, MD as PCP - General (Family Medicine)  PRE-OPERATIVE DIAGNOSIS:  internal strangulated hemorrhoids, grade 4  POST-OPERATIVE DIAGNOSIS:  internal strangulated hemorrhoids, grade 4  PROCEDURE:  2 COLUMN HEMORRHOIDECTOMY  SURGEON:  Surgeon(s): Leighton Ruff, MD  ASSISTANT: none   ANESTHESIA:   local and general  SPECIMEN:  Source of Specimen:  hemorrhoids  DISPOSITION OF SPECIMEN:  PATHOLOGY  COUNTS:  YES  PLAN OF CARE: Patient admitted  PATIENT DISPOSITION:  PACU - hemodynamically stable.  INDICATION: Is a 48 year old male who presented to the urgent office with strangulated grade 4 internal hemorrhoids. He was seen by Dr. Excell Seltzer for and was admitted to the hospital for pain control. I have recommended the patient undergo a standard hemorrhoidectomy. We discussed the risk and benefits of this including the increased risk of anal stenosis. I believe he understands this and has agreed to proceed with surgery.   OR FINDINGS: Enlarged left lateral and right anterior hemorrhoids with thrombosis and necrosis of internal portion  DESCRIPTION: the patient was identified in the preoperative holding area and taken to the OR where they were laid on the operating room table.  Gen. anesthesia was induced without difficulty. The patient was then positioned in prone jackknife position with buttocks gently taped apart.  The patient was then prepped and draped in usual sterile fashion.  SCDs were noted to be in place prior to the initiation of anesthesia. A surgical timeout was performed indicating the correct patient, procedure, positioning and need for preoperative antibiotics.  I began with a digital rectal exam.  Internal hemorrhoids were reduced.  I then placed a Hill-Ferguson anoscope into the anal canal and evaluated this completely.  There was an enlarged left lateral hemorrhoid  as well as an enlarged right anterior hemorrhoid. There were corresponding internal thrombosis and necrosis of both.  I began with the right anterior hemorrhoid. This was incised using a 10 blade scalpel. Dissection was carried down over the sphincter muscles using Metzenbaum scissors. Hemostasis was achieved using a running 2-0 chromic suture for the portion above the dentate line. The remaining thrombosed portion of the hemorrhoid was excised also using Metzenbaum scissors.  A 3-0 chromic running suture was used to close the portion below the dentate line. I repeated this on the left lateral side.  There remained a small anterior skin tag. This was resected using Metzenbaum scissors. It was closed using a running 3-0 chromic suture. After this was completed external was infused as a rectal block. Hemostasis was good by the end of the case. All counts were correct per operating room staff. The patient was extubated and sent to the postanesthesia care unit in stable condition.

## 2013-09-03 NOTE — Anesthesia Preprocedure Evaluation (Addendum)
Anesthesia Evaluation  Patient identified by MRN, date of birth, ID band Patient awake    Reviewed: Allergy & Precautions, H&P , NPO status , Patient's Chart, lab work & pertinent test results  Airway Mallampati: II TM Distance: <3 FB Neck ROM: Full    Dental no notable dental hx.    Pulmonary Current Smoker,  breath sounds clear to auscultation  Pulmonary exam normal       Cardiovascular hypertension, Pt. on medications Rhythm:Regular Rate:Normal     Neuro/Psych negative neurological ROS  negative psych ROS   GI/Hepatic negative GI ROS, Neg liver ROS,   Endo/Other  negative endocrine ROS  Renal/GU negative Renal ROS  negative genitourinary   Musculoskeletal negative musculoskeletal ROS (+)   Abdominal   Peds negative pediatric ROS (+)  Hematology negative hematology ROS (+)   Anesthesia Other Findings   Reproductive/Obstetrics negative OB ROS                           Anesthesia Physical Anesthesia Plan  ASA: II  Anesthesia Plan: General   Post-op Pain Management:    Induction: Intravenous  Airway Management Planned: Oral ETT  Additional Equipment:   Intra-op Plan:   Post-operative Plan: Extubation in OR  Informed Consent: I have reviewed the patients History and Physical, chart, labs and discussed the procedure including the risks, benefits and alternatives for the proposed anesthesia with the patient or authorized representative who has indicated his/her understanding and acceptance.   Dental advisory given  Plan Discussed with: CRNA and Surgeon  Anesthesia Plan Comments:         Anesthesia Quick Evaluation  

## 2013-09-03 NOTE — Anesthesia Postprocedure Evaluation (Signed)
  Anesthesia Post-op Note  Patient: [name] [name]  Procedure(s) Performed: Procedure(s) (LRB): HEMORRHOIDECTOMY (N/A)  Patient Location: PACU  Anesthesia Type: General  Level of Consciousness: awake and alert   Airway and Oxygen Therapy: Patient Spontanous Breathing  Post-op Pain: mild  Post-op Assessment: Post-op Vital signs reviewed, Patient's Cardiovascular Status Stable, Respiratory Function Stable, Patent Airway and No signs of Nausea or vomiting  Last Vitals:  Filed Vitals:   09/03/13 1100  BP: 127/75  Pulse: 80  Temp:   Resp: 16    Post-op Vital Signs: stable   Complications: No apparent anesthesia complications

## 2013-09-03 NOTE — Progress Notes (Signed)
<  principal problem not specified>  Subjective: Pt admitted with grade 4 internal hemorrhoids.  Has had pain for 2-3 days.  Has had occasional prolapse in the past.    Objective: Vital signs in last 24 hours: Temp:  [97.5 F (36.4 C)-98.3 F (36.8 C)] 98.1 F (36.7 C) (02/26 0538) Pulse Rate:  [62-88] 62 (02/26 0538) Resp:  [16-26] 18 (02/26 0538) BP: (100-135)/(66-90) 113/73 mmHg (02/26 0538) SpO2:  [91 %-95 %] 91 % (02/26 0538) Weight:  [188 lb 3.2 oz (85.367 kg)-188 lb 12.8 oz (85.639 kg)] 188 lb 3.2 oz (85.367 kg) (02/25 2041) Last BM Date: 09/01/13  Intake/Output from previous day: 02/25 0701 - 02/26 0700 In: 120 [P.O.:120] Out: -  Intake/Output this shift:    General appearance: alert and cooperative GI: normal findings: soft, non-tender prolapsed internal hemorrhoids with necrosis  Lab Results:  Results for orders placed during the hospital encounter of 09/02/13 (from the past 24 hour(s))  CBC     Status: Abnormal   Collection Time    09/02/13  9:00 PM      Result Value Ref Range   WBC 12.6 (*) 4.0 - 10.5 K/uL   RBC 4.99  4.22 - 5.81 MIL/uL   Hemoglobin 14.7  13.0 - 17.0 g/dL   HCT 42.7  39.0 - 52.0 %   MCV 85.6  78.0 - 100.0 fL   MCH 29.5  26.0 - 34.0 pg   MCHC 34.4  30.0 - 36.0 g/dL   RDW 13.4  11.5 - 15.5 %   Platelets 249  150 - 400 K/uL  COMPREHENSIVE METABOLIC PANEL     Status: Abnormal   Collection Time    09/02/13  9:00 PM      Result Value Ref Range   Sodium 138  137 - 147 mEq/L   Potassium 3.7  3.7 - 5.3 mEq/L   Chloride 100  96 - 112 mEq/L   CO2 26  19 - 32 mEq/L   Glucose, Bld 111 (*) 70 - 99 mg/dL   BUN 9  6 - 23 mg/dL   Creatinine, Ser 0.90  0.50 - 1.35 mg/dL   Calcium 9.3  8.4 - 10.5 mg/dL   Total Protein 6.7  6.0 - 8.3 g/dL   Albumin 3.4 (*) 3.5 - 5.2 g/dL   AST 12  0 - 37 U/L   ALT 14  0 - 53 U/L   Alkaline Phosphatase 91  39 - 117 U/L   Total Bilirubin 0.3  0.3 - 1.2 mg/dL   GFR calc non Af Amer >90  >90 mL/min   GFR calc Af Amer  >90  >90 mL/min     Studies/Results Radiology     MEDS, Scheduled . ertapenem  1 g Intravenous Q24H  . influenza vac split quadrivalent PF  0.5 mL Intramuscular Tomorrow-1000     Assessment: <principal problem not specified> Grade 4 hemorrhoids  Plan: OR for hemorrhoidectomy    LOS: 1 day    Rosario Adie, MD [facility], [facility], [place] [phone]   09/03/2013 7:29 AM

## 2013-09-04 ENCOUNTER — Encounter (HOSPITAL_COMMUNITY): Payer: Self-pay | Admitting: General Surgery

## 2013-09-04 MED ORDER — CALCIUM POLYCARBOPHIL 625 MG PO TABS: 1250.0000 mg | ORAL_TABLET | Freq: Two times a day (BID) | ORAL | Status: DC

## 2013-09-04 MED ORDER — DSS 100 MG PO CAPS: 100.0000 mg | ORAL_CAPSULE | Freq: Three times a day (TID) | ORAL | Status: DC

## 2013-09-04 MED ORDER — OXYCODONE HCL 5 MG PO TABS
5.0000 mg | ORAL_TABLET | ORAL | Status: DC | PRN
Start: 2013-09-04 — End: 2014-03-26

## 2013-09-04 MED ORDER — DIAZEPAM 5 MG PO TABS: 5.0000 mg | ORAL_TABLET | Freq: Four times a day (QID) | ORAL | Status: DC

## 2013-09-04 MED ORDER — DIAZEPAM 5 MG PO TABS: 5.0000 mg | ORAL_TABLET | Freq: Four times a day (QID) | ORAL | Status: DC | PRN

## 2013-09-04 NOTE — Discharge Summary (Signed)
ATTENDING ADDENDUM:  I personally reviewed patient's record, examined the patient, and formulated the following assessment and plan:  Agree with PA note.  Pt with f/u in the office in 3 weeks.

## 2013-09-04 NOTE — Progress Notes (Signed)
Discharge instructions given to pt, verbalized understanding. Left the unit in stable condition. 

## 2013-09-04 NOTE — Discharge Summary (Signed)
Patient ID: [name] [name] MRN: [id number] DOB/AGE: April 10, 1966 48 y.o.  Admit date: 09/02/2013 Discharge date: 09/04/2013  Procedures: 2 column hemorrhoidectomy  Consults: None  Reason for Admission: Patient is a generally healthy 48 year old male who has some previous history of prolapsing internal hemorrhoids with bleeding, never requiring surgery. 2 days ago he developed fairly sudden onset of swelling and severe pain in his rectum. The pain has continued, quite severe, 10 over 10 described as worse than his previous kidney stones. He has had blood tinged watery drainage but no overt bleeding. There is a lot of swelling.   Admission Diagnoses:  1. Circumferential prolapsed gangrenous internal hemorrhoids Patient Active Problem List   Diagnosis Date Noted  . Internal strangulated hemorrhoids 09/02/2013  . Bleeding internal hemorrhoids 01/12/2013  . BLOOD IN [facility] 03/24/2009    Hospital Course:  The patient was admitted and taken to the OR the following day for a hemorrhoidectomy.  He tolerated this well.  On POD 1, his pain was controlled and he was voiding.  He was stable for dc home.  PE: no evidence of prolapse or bleeding around the rectum   Discharge Diagnoses:  Active Problems:   Internal strangulated hemorrhoids s/p hemrroidectomy  Discharge Medications:   Medication List    STOP taking these medications       HYDROcodone-acetaminophen 7.5-325 MG per tablet  Commonly known as:  NORCO     oxyCODONE-acetaminophen 5-325 MG per tablet  Commonly known as:  PERCOCET/ROXICET     phenylephrine-shark liver oil-mineral oil-petrolatum 0.25-3-14-71.9 % rectal ointment  Commonly known as:  PREPARATION H     pramoxine 1 % foam  Commonly known as:  PROCTOFOAM      TAKE these medications       amLODipine-benazepril 5-10 MG per capsule  Commonly known as:  LOTREL  TAKE 1 CAPSULE BY MOUTH EVERY MORNING.     cyclobenzaprine 10 MG tablet  Commonly known as:  FLEXERIL   Take 1 tablet (10 mg total) by mouth 3 (three) times daily as needed.     diazepam 5 MG tablet  Commonly known as:  VALIUM  Take 1 tablet (5 mg total) by mouth every 6 (six) hours as needed for anxiety.     DSS 100 MG Caps  Take 100 mg by mouth 3 (three) times daily.     oxyCODONE 5 MG immediate release tablet  Commonly known as:  ROXICODONE  Take 1-2 tablets (5-10 mg total) by mouth every 4 (four) hours as needed for severe pain.     polycarbophil 625 MG tablet  Commonly known as:  FIBERCON  Take 2 tablets (1,250 mg total) by mouth 2 (two) times daily.     sertraline 100 MG tablet  Commonly known as:  ZOLOFT  TAKE 1 TABLET (100 MG TOTAL) BY MOUTH EVERY MORNING.     tamsulosin 0.4 MG Caps capsule  Commonly known as:  FLOMAX  Take 0.4 mg by mouth daily.        Discharge Instructions:     Follow-up Information   Follow up with Rosario Adie., MD In 3 weeks.   Specialty:  General Surgery   Contact information:   [address] [place] [address] [phone]       Signed: Henreitta Cea 09/04/2013, 10:02 AM

## 2013-09-04 NOTE — Discharge Instructions (Signed)
ANORECTAL SURGERY: POST OP INSTRUCTIONS 1. Take your usually prescribed home medications unless otherwise directed. 2. DIET: During the first few hours after surgery sip on some liquids until you are able to urinate.  It is normal to not urinate for several hours after this surgery.  If you feel uncomfortable, please contact the office for instructions.  After you are able to urinate,you may eat, if you feel like it.  Follow a light bland diet the first 24 hours after arrival home, such as soup, liquids, crackers, etc.  Be sure to include lots of fluids daily (6-8 glasses).  Avoid fast food or heavy meals, as your are more likely to get nauseated.  Eat a low fat diet the next few days after surgery.  Limit caffeine intake to 1-2 servings a day. 3. PAIN CONTROL: a. Pain is best controlled by a usual combination of several different methods TOGETHER: i. Muscle relaxation 1.  Soak in a warm bath (or Sitz bath) three times a day and after bowel movements.  Continue to do this until all pain is resolved. 2. Take the muscle relaxer (Valium) every 6 hours for the first 2 days after surgery  ii. Over the counter pain medication iii. Prescription pain medication b. Most patients will experience some swelling and discomfort in the anus/rectal area and incisions.  Heat such as warm towels, sitz baths, warm baths, etc to help relax tight/sore spots and speed recovery.  Some people prefer to use ice, especially in the first couple days after surgery, as it may decrease the pain and swelling, or alternate between ice & heat.  Experiment to what works for you.  Swelling and bruising can take several weeks to resolve.  Pain can take even longer to completely resolve. c. It is helpful to take an over-the-counter pain medication regularly for the first few weeks.  Choose one of the following that works best for you: i. Naproxen (Aleve, etc)  Two 279m tabs twice a day ii. Ibuprofen (Advil, etc) Three 203mtabs four  times a day (every meal & bedtime) d. A  prescription for pain medication (such as percocet, oxycodone, hydrocodone, etc) should be given to you upon discharge.  Take your pain medication as prescribed.  i. If you are having problems/concerns with the prescription medicine (does not control pain, nausea, vomiting, rash, itching, etc), please call [phone] see if we need to switch you to a different pain medicine that will work better for you and/or control your side effect better. ii. If you need a refill on your pain medication, please contact your pharmacy.  They will contact our office to request authorization. Prescriptions will not be filled after 5 pm or on week-ends. 4. KEEP YOUR BOWELS REGULAR and AVOID CONSTIPATION a. The goal is one to two soft bowel movements a day.  You should at least have a bowel movement every other day. b. Avoid getting constipated.  Between the surgery and the pain medications, it is common to experience some constipation. This can be very painful after rectal surgery.  Increasing fluid intake and taking a fiber supplement (such as Metamucil, Citrucel, FiberCon, etc) 1-2 times a day regularly will usually help prevent this problem from occurring.  A stool softener like colace is also recommended.  This can be purchased over the counter at your pharmacy.  You can take it up to 3 times a day.  If you do not have a bowel movement after 24 hrs since your surgery,  take one does of milk of magnesia.  If you still haven't had a bowel movement 8-12 hours after that dose, take another dose.  If you don't have a bowel movement 48 hrs after surgery, purchase a Fleets enema from the drug store and administer gently per package instructions.  If you still are having trouble with your bowel movements after that, please call the office for further instructions. °c. If you develop diarrhea or have many loose bowel movements, simplify your diet to bland foods & liquids for a few  days.  Stop any stool softeners and decrease your fiber supplement.  Switching to mild anti-diarrheal medications (Kayopectate, Pepto Bismol) can help.  If this worsens or does not improve, please call us. ° °5. Wound Care °a. Remove your bandages before your first bowel movement or 8 hours after surgery.     °b. Remove any wound packing material at this tim,e as well.  You do not need to repack the wound unless instructed otherwise.  Wear an absorbent pad or soft cotton gauze in your underwear to catch any drainage and help keep the area clean. You should change this every 2-3 hours while awake. °c. Keep the area clean and dry.  Bathe / shower every day, especially after bowel movements.  Keep the area clean by showering / bathing over the incision / wound.   It is okay to soak an open wound to help wash it.  Wet wipes or showers / gentle washing after bowel movements is often less traumatic than regular toilet paper. °d. You may have some styrofoam-like soft packing in the rectum which will come out with the first bowel movement.  °e. You will often notice bleeding with bowel movements.  This should slow down by the end of the first week of surgery °f. Expect some drainage.  This should slow down, too, by the end of the first week of surgery.  Wear an absorbent pad or soft cotton gauze in your underwear until the drainage stops. °g. Do Not sit on a rubber or pillow ring.  This can make you symptoms worse.  You may sit on a soft pillow if needed.  °6. ACTIVITIES as tolerated:   °a. You may resume regular (light) daily activities beginning the next day--such as daily self-care, walking, climbing stairs--gradually increasing activities as tolerated.  If you can walk 30 minutes without difficulty, it is safe to try more intense activity such as jogging, treadmill, bicycling, low-impact aerobics, swimming, etc. °b. Save the most intensive and strenuous activity for last such as sit-ups, heavy lifting, contact sports,  etc  Refrain from any heavy lifting or straining until you are off narcotics for pain control.   °c. You may drive when you are no longer taking prescription pain medication, you can comfortably sit for long periods of time, and you can safely maneuver your car and apply brakes. °d. You may have sexual intercourse when it is comfortable.  °7. FOLLOW UP in our office °a. Please call [facility] at [phone] to set up an appointment to see your surgeon in the office for a follow-up appointment approximately 3-4 weeks after your surgery. °b. Make sure that you call for this appointment the day you arrive home to insure a convenient appointment time. °10. IF YOU HAVE DISABILITY OR FAMILY LEAVE FORMS, BRING THEM TO THE OFFICE FOR PROCESSING.  DO NOT GIVE THEM TO YOUR DOCTOR. ° ° ° ° °WHEN TO CALL US [phone]: °1. Poor pain control °  2. Reactions / problems with new medications (rash/itching, nausea, etc)  °3. Fever over 101.5 F (38.5 C) °4. Inability to urinate °5. Nausea and/or vomiting °6. Worsening swelling or bruising °7. Continued bleeding from incision. °8. Increased pain, redness, or drainage from the incision ° °The clinic staff is available to answer your questions during regular business hours (8:30am-5pm).  Please don’t hesitate to call and ask to speak to one of our nurses for clinical concerns.   A surgeon from [facility] is always on call at the hospitals °  °If you have a medical emergency, go to the nearest emergency room or call 911. °  ° °[facility] °[address], [address], [place], [place]  [address] ? °MAIN: [phone] ? TOLL FREE: [phone] ? °FAX [phone] °[email or web address] ° ° °

## 2013-09-07 NOTE — H&P (Signed)
HPI  Patient is a generally healthy 48 year old male who has some previous history of prolapsing internal hemorrhoids with bleeding, never requiring surgery. 2 days ago he developed fairly sudden onset of swelling and severe pain in his rectum. The pain has continued, quite severe, 10 over 10 described as worse than his previous kidney stones. He has had blood tinged watery drainage but no overt bleeding. There is a lot of swelling.  Past Medical History   Diagnosis  Date   .  Hypertension    .  Depression    .  Anxiety    .  Chronic kidney disease      kidney stones   .  Bulging discs    .  Kidney stones     Past Surgical History   Procedure  Laterality  Date   .  Lithotripsy     .  Cystoscopy w/ ureteral stent placement   09-2011   .  Cystoscopy w/ ureteral stent removal   10-2011    Current Outpatient Prescriptions   Medication  Sig  Dispense  Refill   .  amLODipine-benazepril (LOTREL) 5-10 MG per capsule  TAKE 1 CAPSULE BY MOUTH EVERY MORNING.  30 capsule  2   .  cyclobenzaprine (FLEXERIL) 10 MG tablet  Take 1 tablet (10 mg total) by mouth 3 (three) times daily as needed.  21 tablet  0   .  HYDROcodone-acetaminophen (NORCO) 7.5-325 MG per tablet  Take 1 tablet by mouth every 6 (six) hours as needed for moderate pain.  30 tablet  0   .  oxyCODONE-acetaminophen (PERCOCET/ROXICET) 5-325 MG per tablet  Take 1 tablet by mouth every 4 (four) hours as needed for severe pain.  20 tablet  0   .  pramoxine (PROCTOFOAM) 1 % foam  Place 1 application rectally 3 (three) times daily as needed for itching.  15 g  0   .  sertraline (ZOLOFT) 100 MG tablet  TAKE 1 TABLET (100 MG TOTAL) BY MOUTH EVERY MORNING.  30 tablet  3   .  tamsulosin (FLOMAX) 0.4 MG CAPS capsule  Take 0.4 mg by mouth daily.      No current facility-administered medications for this visit.   No Known Allergies  History   Substance Use Topics   .  Smoking status:  Current Every Day Smoker -- 0.50 packs/day for 20 years     Types:   Cigarettes     Start date:  12/31/1992   .  Smokeless tobacco:  Never Used   .  Alcohol Use:  Yes      Comment: occasional   Review of Systems  Constitutional: Negative for fever and chills.  Respiratory: Negative.  Cardiovascular: Negative.  Objective:   Physical Exam  BP 135/90  Pulse 88  Temp(Src) 98.2 F (36.8 C) (Temporal)  Resp 16  Ht 5' 10.5" (1.791 m)  Wt 188 lb 12.8 oz (85.639 kg)  BMI 26.70 kg/m2  General: Well-developed Caucasian male in obvious pain  Skin: No rash or infection  HEENT: No masses. Sclera nonicteric  Lungs: Clear breath sounds bilaterally  Cardiac: Regular rate and rhythm no murmurs  Abdomen: Soft and nontender  Rectal: There are large circumferential prolapsed and gangrenous internal hemorrhoids with exquisite tenderness and some edema and swelling of external hemorrhoids and perianal skin.  Neurologic: Alert and oriented. Gait normal.  Assessment:   Circumferential prolapsed gangrenous internal hemorrhoids. Patient will require emergency formal hemorrhoidectomy. He will be admitted to the hospital  today and made n.p.o., local care and IV antibiotics and plan urgent hemorrhoidectomy.  Plan:   As above    Delayed entry.

## 2013-09-18 ENCOUNTER — Encounter (INDEPENDENT_AMBULATORY_CARE_PROVIDER_SITE_OTHER): Payer: Self-pay

## 2013-09-21 ENCOUNTER — Telehealth (INDEPENDENT_AMBULATORY_CARE_PROVIDER_SITE_OTHER): Payer: Self-pay

## 2013-09-21 NOTE — Telephone Encounter (Signed)
Calling to inform patient of  Post op appt on Thursday 10/15/13 at 2:00pm.  Pt had requested Friday afternoon but Dr. Marcello Moores does NOT have clinic on Friday afternoons.  Please inform patient of appointment details.

## 2013-09-23 ENCOUNTER — Encounter (INDEPENDENT_AMBULATORY_CARE_PROVIDER_SITE_OTHER): Payer: 59 | Admitting: General Surgery

## 2013-10-15 ENCOUNTER — Encounter (INDEPENDENT_AMBULATORY_CARE_PROVIDER_SITE_OTHER): Payer: 59 | Admitting: General Surgery

## 2013-10-20 ENCOUNTER — Encounter (INDEPENDENT_AMBULATORY_CARE_PROVIDER_SITE_OTHER): Payer: Self-pay | Admitting: General Surgery

## 2013-10-23 ENCOUNTER — Other Ambulatory Visit: Payer: Self-pay | Admitting: Family Medicine

## 2013-10-23 NOTE — Telephone Encounter (Signed)
Last filled 04/2013.

## 2013-11-24 ENCOUNTER — Other Ambulatory Visit: Payer: Self-pay | Admitting: Family Medicine

## 2014-01-22 ENCOUNTER — Encounter: Payer: Self-pay | Admitting: Gastroenterology

## 2014-01-31 ENCOUNTER — Other Ambulatory Visit: Payer: Self-pay | Admitting: Family Medicine

## 2014-02-15 ENCOUNTER — Encounter: Payer: Self-pay | Admitting: Gastroenterology

## 2014-03-02 ENCOUNTER — Other Ambulatory Visit: Payer: Self-pay | Admitting: Family Medicine

## 2014-03-04 NOTE — Telephone Encounter (Signed)
Last ov 2/15. 

## 2014-03-13 ENCOUNTER — Other Ambulatory Visit: Payer: Self-pay | Admitting: Family Medicine

## 2014-03-26 ENCOUNTER — Ambulatory Visit (HOSPITAL_COMMUNITY)
Admission: AD | Admit: 2014-03-26 | Discharge: 2014-03-26 | Disposition: A | Discharge status: Home / Self Care | Payer: 59 | Source: Ambulatory Visit | Attending: Urology | Admitting: Urology

## 2014-03-26 ENCOUNTER — Ambulatory Visit (HOSPITAL_COMMUNITY): Payer: 59 | Admitting: Anesthesiology

## 2014-03-26 ENCOUNTER — Other Ambulatory Visit: Payer: Self-pay | Admitting: Urology

## 2014-03-26 ENCOUNTER — Encounter (HOSPITAL_COMMUNITY): Payer: 59 | Admitting: Anesthesiology

## 2014-03-26 ENCOUNTER — Encounter (HOSPITAL_COMMUNITY)
Admission: AD | Disposition: A | Discharge status: Home / Self Care | Payer: Self-pay | Source: Ambulatory Visit | Attending: Urology

## 2014-03-26 ENCOUNTER — Encounter (HOSPITAL_COMMUNITY): Payer: Self-pay | Admitting: Anesthesiology

## 2014-03-26 DIAGNOSIS — R3129 Other microscopic hematuria: Secondary | ICD-10-CM | POA: Insufficient documentation

## 2014-03-26 DIAGNOSIS — R1031 Right lower quadrant pain: Secondary | ICD-10-CM | POA: Diagnosis not present

## 2014-03-26 DIAGNOSIS — N201 Calculus of ureter: Secondary | ICD-10-CM | POA: Insufficient documentation

## 2014-03-26 DIAGNOSIS — N1330 Unspecified hydronephrosis: Secondary | ICD-10-CM | POA: Insufficient documentation

## 2014-03-26 DIAGNOSIS — Z87891 Personal history of nicotine dependence: Secondary | ICD-10-CM | POA: Insufficient documentation

## 2014-03-26 DIAGNOSIS — N200 Calculus of kidney: Secondary | ICD-10-CM | POA: Insufficient documentation

## 2014-03-26 HISTORY — PX: CYSTOSCOPY WITH RETROGRADE PYELOGRAM, URETEROSCOPY AND STENT PLACEMENT: SHX5789

## 2014-03-26 HISTORY — PX: STONE EXTRACTION WITH BASKET: SHX5318

## 2014-03-26 HISTORY — PX: HOLMIUM LASER APPLICATION: SHX5852

## 2014-03-26 LAB — CBC
HCT: 42.1 % (ref 39.0–52.0)
HEMOGLOBIN: 14.4 g/dL (ref 13.0–17.0)
MCH: 29.2 pg (ref 26.0–34.0)
MCHC: 34.2 g/dL (ref 30.0–36.0)
MCV: 85.4 fL (ref 78.0–100.0)
Platelets: 227 10*3/uL (ref 150–400)
RBC: 4.93 MIL/uL (ref 4.22–5.81)
RDW: 13.1 % (ref 11.5–15.5)
WBC: 10.4 10*3/uL (ref 4.0–10.5)

## 2014-03-26 LAB — BASIC METABOLIC PANEL
Anion gap: 14 (ref 5–15)
BUN: 14 mg/dL (ref 6–23)
CHLORIDE: 103 meq/L (ref 96–112)
CO2: 23 mEq/L (ref 19–32)
Calcium: 8.7 mg/dL (ref 8.4–10.5)
Creatinine, Ser: 1.41 mg/dL — ABNORMAL HIGH (ref 0.50–1.35)
GFR calc Af Amer: 66 mL/min — ABNORMAL LOW (ref >90)
GFR calc non Af Amer: 57 mL/min — ABNORMAL LOW (ref >90)
GLUCOSE: 108 mg/dL — AB (ref 70–99)
POTASSIUM: 3.9 meq/L (ref 3.7–5.3)
Sodium: 140 mEq/L (ref 137–147)

## 2014-03-26 SURGERY — CYSTOURETEROSCOPY, WITH RETROGRADE PYELOGRAM AND STENT INSERTION
Anesthesia: General | Site: Ureter | Laterality: Left

## 2014-03-26 MED ORDER — BELLADONNA ALKALOIDS-OPIUM 16.2-60 MG RE SUPP
RECTAL | Status: AC
  Filled 2014-03-26: qty 1

## 2014-03-26 MED ORDER — 0.9 % SODIUM CHLORIDE (POUR BTL) OPTIME
TOPICAL | Status: DC | PRN
  Administered 2014-03-26: 1000 mL

## 2014-03-26 MED ORDER — SODIUM CHLORIDE 0.9 % IR SOLN
Status: DC | PRN
  Administered 2014-03-26: 3000 mL

## 2014-03-26 MED ORDER — FENTANYL CITRATE 0.05 MG/ML IJ SOLN
INTRAMUSCULAR | Status: AC
  Filled 2014-03-26: qty 2

## 2014-03-26 MED ORDER — PHENAZOPYRIDINE HCL 100 MG PO TABS: 100.0000 mg | ORAL_TABLET | Freq: Three times a day (TID) | ORAL | Status: DC | PRN

## 2014-03-26 MED ORDER — DEXAMETHASONE SODIUM PHOSPHATE 10 MG/ML IJ SOLN
INTRAMUSCULAR | Status: DC | PRN
  Administered 2014-03-26: 10 mg via INTRAVENOUS

## 2014-03-26 MED ORDER — LACTATED RINGERS IV SOLN
INTRAVENOUS | Status: DC
  Administered 2014-03-26: 1000 mL via INTRAVENOUS

## 2014-03-26 MED ORDER — ONDANSETRON HCL 4 MG/2ML IJ SOLN
INTRAMUSCULAR | Status: DC | PRN
  Administered 2014-03-26: 4 mg via INTRAVENOUS

## 2014-03-26 MED ORDER — FENTANYL CITRATE 0.05 MG/ML IJ SOLN: 25.0000 ug | INTRAMUSCULAR | Status: DC | PRN

## 2014-03-26 MED ORDER — ONDANSETRON HCL 4 MG/2ML IJ SOLN
INTRAMUSCULAR | Status: AC
  Filled 2014-03-26: qty 2

## 2014-03-26 MED ORDER — DEXAMETHASONE SODIUM PHOSPHATE 10 MG/ML IJ SOLN
INTRAMUSCULAR | Status: AC
  Filled 2014-03-26: qty 1

## 2014-03-26 MED ORDER — CEFAZOLIN SODIUM-DEXTROSE 2-3 GM-% IV SOLR
INTRAVENOUS | Status: AC
  Filled 2014-03-26: qty 50

## 2014-03-26 MED ORDER — HYDROCODONE-ACETAMINOPHEN 5-325 MG PO TABS: 1.0000 | ORAL_TABLET | Freq: Four times a day (QID) | ORAL | Status: DC | PRN

## 2014-03-26 MED ORDER — MIDAZOLAM HCL 5 MG/5ML IJ SOLN
INTRAMUSCULAR | Status: DC | PRN
  Administered 2014-03-26: 2 mg via INTRAVENOUS

## 2014-03-26 MED ORDER — SUCCINYLCHOLINE CHLORIDE 20 MG/ML IJ SOLN
INTRAMUSCULAR | Status: DC | PRN
  Administered 2014-03-26: 100 mg via INTRAVENOUS

## 2014-03-26 MED ORDER — MIDAZOLAM HCL 2 MG/2ML IJ SOLN
INTRAMUSCULAR | Status: AC
  Filled 2014-03-26: qty 2

## 2014-03-26 MED ORDER — IOHEXOL 300 MG/ML  SOLN
INTRAMUSCULAR | Status: DC | PRN
  Administered 2014-03-26: 30 mL via URETHRAL

## 2014-03-26 MED ORDER — LIDOCAINE HCL 2 % EX GEL
CUTANEOUS | Status: DC | PRN
  Administered 2014-03-26: 1 via TOPICAL

## 2014-03-26 MED ORDER — PROPOFOL 10 MG/ML IV BOLUS
INTRAVENOUS | Status: DC | PRN
  Administered 2014-03-26: 100 mg via INTRAVENOUS
  Administered 2014-03-26: 200 mg via INTRAVENOUS

## 2014-03-26 MED ORDER — CIPROFLOXACIN HCL 500 MG PO TABS: 500.0000 mg | ORAL_TABLET | Freq: Two times a day (BID) | ORAL | Status: DC

## 2014-03-26 MED ORDER — PROPOFOL 10 MG/ML IV BOLUS
INTRAVENOUS | Status: AC
  Filled 2014-03-26: qty 20

## 2014-03-26 MED ORDER — CEFAZOLIN SODIUM-DEXTROSE 2-3 GM-% IV SOLR
2.0000 g | INTRAVENOUS | Status: AC
  Administered 2014-03-26: 2 g via INTRAVENOUS

## 2014-03-26 MED ORDER — PROMETHAZINE HCL 25 MG/ML IJ SOLN: 6.2500 mg | INTRAMUSCULAR | Status: DC | PRN

## 2014-03-26 MED ORDER — LIDOCAINE HCL 2 % EX GEL
CUTANEOUS | Status: AC
  Filled 2014-03-26: qty 10

## 2014-03-26 MED ORDER — FENTANYL CITRATE 0.05 MG/ML IJ SOLN
INTRAMUSCULAR | Status: DC | PRN
  Administered 2014-03-26: 100 ug via INTRAVENOUS
  Administered 2014-03-26: 50 ug via INTRAVENOUS

## 2014-03-26 MED ORDER — BELLADONNA ALKALOIDS-OPIUM 16.2-60 MG RE SUPP
RECTAL | Status: DC | PRN
  Administered 2014-03-26: 1 via RECTAL

## 2014-03-26 SURGICAL SUPPLY — 22 items
BAG URINE DRAINAGE (UROLOGICAL SUPPLIES) ×2 IMPLANT
BASKET ZERO TIP NITINOL 2.4FR (BASKET) ×2 IMPLANT
BSKT STON RTRVL ZERO TP 2.4FR (BASKET) ×2
CATH INTERMIT  6FR 70CM (CATHETERS) ×2 IMPLANT
CLOTH BEACON ORANGE TIMEOUT ST (SAFETY) ×2 IMPLANT
DRAPE CAMERA CLOSED 9X96 (DRAPES) ×4 IMPLANT
DRAPE LG THREE QUARTER DISP (DRAPES) ×2 IMPLANT
FIBER LASER FLEXIVA 1000 (UROLOGICAL SUPPLIES) ×2 IMPLANT
FIBER LASER FLEXIVA 200 (UROLOGICAL SUPPLIES) ×2 IMPLANT
FIBER LASER FLEXIVA 365 (UROLOGICAL SUPPLIES) ×4 IMPLANT
FIBER LASER FLEXIVA 550 (UROLOGICAL SUPPLIES) ×2 IMPLANT
FIBER LASER TRAC TIP (UROLOGICAL SUPPLIES) ×2 IMPLANT
GLOVE BIOGEL M STRL SZ7.5 (GLOVE) ×4 IMPLANT
GOWN STRL REUS W/TWL XL LVL3 (GOWN DISPOSABLE) ×4 IMPLANT
GUIDEWIRE .038 (WIRE) ×2 IMPLANT
GUIDEWIRE ANG ZIPWIRE 038X150 (WIRE) IMPLANT
GUIDEWIRE STR DUAL SENSOR (WIRE) ×4 IMPLANT
IV NS IRRIG 3000ML ARTHROMATIC (IV SOLUTION) ×4 IMPLANT
PACK CYSTO (CUSTOM PROCEDURE TRAY) ×4 IMPLANT
SHEATH URET ACCESS 12FR/55CM (UROLOGICAL SUPPLIES) ×2 IMPLANT
STENT CONTOUR 6FRX26X.038 (STENTS) ×4 IMPLANT
TUBING IRRIGATION (MISCELLANEOUS) ×2 IMPLANT

## 2014-03-26 NOTE — Discharge Instructions (Signed)
1. You may see some blood in the urine and may have some burning with urination for 48-72 hours. You also may notice that you have to urinate more frequently or urgently after your procedure which is normal.  2. You should call should you develop an inability urinate, fever > 101, persistent nausea and vomiting that prevents you from eating or drinking to stay hydrated.  3. You have 2 stents in. You will likely urinate more frequently and urgently until the stent is removed and you may experience some discomfort/pain in the lower abdomen and flank especially when urinating. You may take pain medication prescribed to you if needed for pain. You may also intermittently have blood in the urine until the stent is removed. 4. Call [facility] [phone]) on Monday to schedule follow up appointment.

## 2014-03-26 NOTE — Anesthesia Preprocedure Evaluation (Addendum)
Anesthesia Evaluation  Patient identified by MRN, date of birth, ID band Patient awake    Reviewed: Allergy & Precautions, H&P , NPO status , Patient's Chart, lab work & pertinent test results  Airway Mallampati: II TM Distance: >3 FB Neck ROM: Full    Dental no notable dental hx.    Pulmonary Current Smoker,  breath sounds clear to auscultation  Pulmonary exam normal       Cardiovascular Exercise Tolerance: Good hypertension, Pt. on medications Rhythm:Regular Rate:Normal     Neuro/Psych PSYCHIATRIC DISORDERS Anxiety Depression negative neurological ROS     GI/Hepatic negative GI ROS, Neg liver ROS,   Endo/Other  negative endocrine ROS  Renal/GU Renal disease  negative genitourinary   Musculoskeletal negative musculoskeletal ROS (+)   Abdominal   Peds negative pediatric ROS (+)  Hematology negative hematology ROS (+)   Anesthesia Other Findings   Reproductive/Obstetrics negative OB ROS                          Anesthesia Physical Anesthesia Plan  ASA: II and emergent  Anesthesia Plan: General   Post-op Pain Management:    Induction: Intravenous  Airway Management Planned: Oral ETT  Additional Equipment:   Intra-op Plan:   Post-operative Plan: Extubation in OR  Informed Consent: I have reviewed the patients History and Physical, chart, labs and discussed the procedure including the risks, benefits and alternatives for the proposed anesthesia with the patient or authorized representative who has indicated his/her understanding and acceptance.   Dental advisory given  Plan Discussed with: CRNA  Anesthesia Plan Comments:        Anesthesia Quick Evaluation

## 2014-03-26 NOTE — H&P (Signed)
Reason For Visit     Seen today as a work-in for RLQ pain and gross hematuria.   Active Problems Problems   1. Abdominal pain, RLQ (right lower quadrant) (789.03)   Assessed By: Jimmey Ralph (Urology); Last Assessed: 26 Mar 2014  2. Bilateral kidney stones (592.0)   Assessed By: Jimmey Ralph (Urology); Last Assessed: 26 Mar 2014  3. Calculus of left ureter (592.1)   Assessed By: Jimmey Ralph (Urology); Last Assessed: 26 Mar 2014  4. Calculus of right ureter (592.1)   Assessed By: Jimmey Ralph (Urology); Last Assessed: 26 Mar 2014  5. Hydronephrosis, bilateral (591)   Assessed By: Jimmey Ralph (Urology); Last Assessed: 26 Mar 2014  6. Microscopic hematuria (599.72)   Assessed By: Jimmey Ralph (Urology); Last Assessed: 26 Mar 2014  History of Present Illness        48 YO male patient of Dr. Simone Curia seen today as a work-in for RLQ pain and gross hematuria X 2 weeks. Last seen 2013.  GU HX:         Nephrolithiasis: In 02/10/01 underwent lithotripsy for a right ureteral stone.  He has also undergone ureteroscopy in the past.   He was found to have 2 stones in his upper right ureter and because of the large volume of stone and and their size it was felt the placement of the stent followed by lithotripsy would be the best way to manage his stones. On 10/07/08 attempted to place a stent in the right ureter however the stone that was most distal was impacted and could not get stent or even a guidewire past the stone even under direct visualization using flexible ureteroscopy; therefore performed lithotripsy that same day on the impacted distal ureteral stone with what appeared to be good breakup of the stone at the time of the procedure. The second stone was treated with ESWL on 10/28/08 with excellent break up.  He then had a distal right ureteral stone treated with ESL in 9/13.  Sept 2015 Interval HX:  Today states that sxs began about 2 weeks ago. Saw Urgent Care in [place], [place]. No  radiologic imaging. Continues to have RLQ pain which is radiating into right testicle. Has taken PRN Hydrocodone he was given but with very little pain relief. Is taking Tamsulosin 2 daily.   Past Medical History Problems   1. History of Anxiety (300.00)  2. History of Calculus of ureter (592.1)  3. History of Heartburn With Regurgitation (787.1)  4. History of hypertension (V12.59)  Surgical History Problems   1. History of Cystoscopy With Ureteroscopy Right  2. History of Lithotripsy  3. History of Lithotripsy  4. History of Lithotripsy  5. History of Lithotripsy  6. History of Lithotripsy  Current Meds  1. Flomax 0.4 MG Oral Capsule;  Therapy: (Recorded:18Sep2015) to Recorded  2. Zoloft TABS;  Therapy: (Recorded:29Mar2010) to Recorded  Allergies Medication   1. No Known Drug Allergies  Family History Problems   1. Family history of Family Health Status Number Of Children   1 child  Social History Problems   1. Alcohol Use  2. Caffeine Use   3 cups a day  3. Former Smoker   1 pack per day  4. Marital History - Currently Married  Review of Systems Genitourinary, constitutional, skin, eye, otolaryngeal, hematologic/lymphatic, cardiovascular, pulmonary, endocrine, musculoskeletal, gastrointestinal, neurological and psychiatric system(s) were reviewed and pertinent findings if present are noted.  Genitourinary: hematuria and testicular pain.  Gastrointestinal: flank pain and abdominal pain.  Vitals Vital Signs [Data Includes: Last 1 Day]  Recorded: 18Sep2015 01:57PM  Blood Pressure: 150 / 100 Temperature: 98.5 F Heart Rate: 55  Physical Exam Constitutional: Well nourished and well developed . No acute distress. The patient appears well hydrated.  Abdomen: The abdomen is flat. Moderate tenderness in the RLQ is present, but no LLQ tenderness. No CVA tenderness.    Results/Data Urine [Data Includes: Last 1 Day]   18Sep2015  COLOR YELLOW   APPEARANCE CLEAR    SPECIFIC GRAVITY 1.025   pH 6.0   GLUCOSE NEG mg/dL  BILIRUBIN NEG   KETONE NEG mg/dL  BLOOD LARGE   PROTEIN TRACE mg/dL  UROBILINOGEN 0.2 mg/dL  NITRITE NEG   LEUKOCYTE ESTERASE NEG   SQUAMOUS EPITHELIAL/HPF FEW   WBC 0-2 WBC/hpf  RBC 7-10 RBC/hpf  BACTERIA NONE SEEN   CRYSTALS NONE SEEN   CASTS NONE SEEN    The following images/tracing/specimen were independently visualized:  CT urogram: shows tiny left renal caclulus. Severe right hydronephrosis and hydroureter along with perinephric stranding secondary to large 14 mm right UPJ calculus. Moderate left hydronephrosis and hydroureter secondary to 7 mm left mid ureteral calculus along with a 5 mm distal left ureteral calculus.  The following clinical lab reports were reviewed:  UA- small amount of microscopic hematuria RBC: 7-10/HPF.    Assessment Assessed   1. Abdominal pain, RLQ (right lower quadrant) (789.03)  2. Microscopic hematuria (599.72)  3. Bilateral kidney stones (592.0)  4. Calculus of left ureter (592.1)  5. Calculus of right ureter (592.1)  6. Hydronephrosis, bilateral (591)  Plan Abdominal pain, RLQ (right lower quadrant)   1. Administered: Promethazine HCl 25 MG/ML Injection Solution  2. AU CT-STONE PROTOCOL; Status:In Progress - Specimen/Data Collected;   Done:  [id number] 12:00AM Bilateral kidney stones   3. Administered: Ketorolac Tromethamine 60 MG/2ML Injection Solution Health Maintenance   4. UA With REFLEX; [Do Not Release]; Status:Complete;   Done: [id number] 01:40PM      Ketorolac 60 mg IM today Promethazine 25 mg IM today  Discussed with Dr. Risa Grill (on-call) with bilateral ureteral obstruction recommend that pt proceed to with cystourethroscopy, B RPG, B double J stent placement. Will need f/u ESWL for large right ureteral calculus. Risks and benefits discussed with pt by Dr. Risa Grill and he wishes to proceed. Will remain NPO

## 2014-03-26 NOTE — Op Note (Signed)
Preoperative diagnosis: Bilateral ureteral calculus  Postoperative diagnosis: Bilateral ureteral calculus  Procedure:  1. Cystoscopy 2. Left ureteroscopy with laser lithotripsy and stone removal 3. Bilateral ureteral stent placement (6Fr x 26 cm) 4. Bilateral retrograde pyelography with interpretation  Surgeon: Dr. Rana Snare  Resident: Milon Score, MD  Anesthesia: General  Complications: None  Intraoperative findings: Right retrograde pyelography demonstrated significant hydronephrosis and a narrow UPJ consistent with large UPJ stone. Left RPG with several areas of narrowing along the course of the ureter with distention proximal to these narrow area.  EBL: Minimal  Specimens: 1. Left ureteral calculus  Disposition of specimens: [facility] for stone analysis  Indication: [name] [name] is a 48 y.o. male patient with urolithiasis. After reviewing the management options for treatment, they elected to proceed with the above surgical procedure(s). We have discussed the potential benefits and risks of the procedure, side effects of the proposed treatment, the likelihood of the patient achieving the goals of the procedure, and any potential problems that might occur during the procedure or recuperation. Informed consent has been obtained.  Description of procedure:  The patient was taken to the operating room and general anesthesia was induced.  The patient was placed in the dorsal lithotomy position, prepped and draped in the usual sterile fashion, and preoperative antibiotics were administered. A preoperative time-out was performed.   Cystourethroscopy was performed.  The patient's urethra was examined and was normal. The bladder was then systematically examined in its entirety. There was no evidence for any bladder tumors, stones, or other mucosal pathology.    Attention then turned to the Right ureteral orifice and a ureteral catheter was used to intubate  the ureteral orifice.  Omnipaque contrast was injected through the ureteral catheter and a retrograde pyelogram was performed with findings as dictated above.  A 0.38 sensor guidewire was then advanced up the Right ureter into the renal pelvis under fluoroscopic guidance and the open ended was removed. A 6Fr x 26cm ureteral stent was placed in good position.   Attention was turned to the left ureteral orifice where a ureteral catheter was used to intubate the ureteral orifice.  Omnipaque contrast was injected through the ureteral catheter and a retrograde pyelogram was performed with findings as dictated above.  The short semi-rigid ureteroscope was advanced into the ureter up to the 4-86mm distal ureteral stone. The stone was then fragmented with the 200 micron holmium laser fiber on a setting of 6J and frequency of 6 Hz. The stone was very brittle and fragmented quickly. All sizable stones were then removed with a zero tip nitinol basket.  Reinspection of the ureter/renal pelvis revealed no remaining visible stones or fragments of significant size. More proximally, a mild narrowing of the mid ureter was noted that wouldn't accommodate the ureteroscope. The inner cannula of a ureteral sheath was used to dilate which allowed passage of the scope. No additional stones were noted up to the proximal ureter. There may have been some very small stones more proximally but are amenable to passage with passive ureteral dilation.   The guidewire was backloaded through the cystoscope and a ureteral stent was advance over the wire using Seldinger technique.  The stent was positioned appropriately under fluoroscopic and cystoscopic guidance.  The wire was then removed with an adequate stent curl noted in the renal pelvis as well as in the bladder.  The bladder was then emptied and the procedure ended.  The patient appeared to tolerate the procedure well  and without complications.  The patient was able to be awakened  and transferred to the recovery unit in satisfactory condition.

## 2014-03-26 NOTE — Transfer of Care (Signed)
Immediate Anesthesia Transfer of Care Note  Patient: [name] [name]  Procedure(s) Performed: Procedure(s) (LRB): CYSTOSCOPY WITH RETROGRADE PYELOGRAM, URETEROSCOPY AND STENT PLACEMENT (Bilateral) HOLMIUM LASER APPLICATION (Bilateral) STONE EXTRACTION WITH BASKET (Left)  Patient Location: PACU  Anesthesia Type: General  Level of Consciousness: sedated, patient cooperative and responds to stimulation  Airway & Oxygen Therapy: Patient Spontanous Breathing and Patient connected to face mask oxgen  Post-op Assessment: Report given to PACU RN and Post -op Vital signs reviewed and stable  Post vital signs: Reviewed and stable  Complications: No apparent anesthesia complications

## 2014-03-27 NOTE — Anesthesia Postprocedure Evaluation (Signed)
  Anesthesia Post-op Note  Patient: [name] [name]  Procedure(s) Performed: Procedure(s) (LRB): CYSTOSCOPY WITH RETROGRADE PYELOGRAM, URETEROSCOPY AND STENT PLACEMENT (Bilateral) HOLMIUM LASER APPLICATION (Bilateral) STONE EXTRACTION WITH BASKET (Left)  Patient Location: PACU  Anesthesia Type: General  Level of Consciousness: awake and alert   Airway and Oxygen Therapy: Patient Spontanous Breathing  Post-op Pain: mild  Post-op Assessment: Post-op Vital signs reviewed, Patient's Cardiovascular Status Stable, Respiratory Function Stable, Patent Airway and No signs of Nausea or vomiting  Last Vitals:  Filed Vitals:   03/26/14 2001  BP: 150/89  Pulse: 55  Temp: 36.7 C  Resp: 16    Post-op Vital Signs: stable   Complications: No apparent anesthesia complications

## 2014-03-29 ENCOUNTER — Encounter (HOSPITAL_COMMUNITY): Payer: Self-pay | Admitting: Urology

## 2014-04-07 ENCOUNTER — Other Ambulatory Visit: Payer: Self-pay | Admitting: Urology

## 2014-04-08 ENCOUNTER — Encounter (HOSPITAL_COMMUNITY): Payer: Self-pay | Admitting: Pharmacy Technician

## 2014-04-09 ENCOUNTER — Encounter (HOSPITAL_COMMUNITY): Payer: Self-pay | Admitting: *Deleted

## 2014-04-12 ENCOUNTER — Ambulatory Visit (HOSPITAL_COMMUNITY)
Admission: RE | Admit: 2014-04-12 | Discharge: 2014-04-12 | Disposition: A | Discharge status: Home / Self Care | Payer: 59 | Source: Ambulatory Visit | Attending: Urology | Admitting: Urology

## 2014-04-12 ENCOUNTER — Ambulatory Visit (HOSPITAL_COMMUNITY): Payer: 59

## 2014-04-12 ENCOUNTER — Encounter (HOSPITAL_COMMUNITY)
Admission: RE | Disposition: A | Discharge status: Home / Self Care | Payer: Self-pay | Source: Ambulatory Visit | Attending: Urology

## 2014-04-12 ENCOUNTER — Encounter (HOSPITAL_COMMUNITY): Payer: Self-pay | Admitting: General Practice

## 2014-04-12 DIAGNOSIS — I10 Essential (primary) hypertension: Secondary | ICD-10-CM | POA: Insufficient documentation

## 2014-04-12 DIAGNOSIS — F419 Anxiety disorder, unspecified: Secondary | ICD-10-CM | POA: Insufficient documentation

## 2014-04-12 DIAGNOSIS — R12 Heartburn: Secondary | ICD-10-CM | POA: Diagnosis not present

## 2014-04-12 DIAGNOSIS — N200 Calculus of kidney: Secondary | ICD-10-CM

## 2014-04-12 DIAGNOSIS — Z87891 Personal history of nicotine dependence: Secondary | ICD-10-CM | POA: Insufficient documentation

## 2014-04-12 SURGERY — LITHOTRIPSY, ESWL
Anesthesia: LOCAL | Laterality: Right

## 2014-04-12 MED ORDER — SODIUM CHLORIDE 0.9 % IV SOLN
INTRAVENOUS | Status: DC
  Administered 2014-04-12: 15:00:00 via INTRAVENOUS

## 2014-04-12 MED ORDER — DIAZEPAM 5 MG PO TABS
10.0000 mg | ORAL_TABLET | ORAL | Status: AC
  Administered 2014-04-12: 10 mg via ORAL
  Filled 2014-04-12: qty 2

## 2014-04-12 MED ORDER — DIPHENHYDRAMINE HCL 25 MG PO CAPS
25.0000 mg | ORAL_CAPSULE | ORAL | Status: AC
  Administered 2014-04-12: 25 mg via ORAL
  Filled 2014-04-12 (×2): qty 1

## 2014-04-12 MED ORDER — CIPROFLOXACIN HCL 500 MG PO TABS
500.0000 mg | ORAL_TABLET | ORAL | Status: AC
  Administered 2014-04-12: 500 mg via ORAL
  Filled 2014-04-12: qty 1

## 2014-04-12 MED ORDER — PHENAZOPYRIDINE HCL 200 MG PO TABS: 200.0000 mg | ORAL_TABLET | Freq: Three times a day (TID) | ORAL | Status: DC | PRN

## 2014-04-12 MED ORDER — TROSPIUM CHLORIDE ER 60 MG PO CP24: 60.0000 mg | ORAL_CAPSULE | Freq: Every day | ORAL | Status: DC

## 2014-04-12 NOTE — H&P (Signed)
Reason For Visit      Mr. Stockhausen is a 48 year old male with nephrolithiasis.   History of Present Illness         Nephrolithiasis: In 02/10/01 underwent lithotripsy for a right ureteral stone. He has also undergone ureteroscopy in the past.   He was found to have 2 stones in his upper right ureter and because of the large volume of stone and and their size I felt the placement of the stent followed by lithotripsy would be the best way to manage his stones. On 10/07/08 I attempted to place a stent in the right ureter however the stone that was most distal was impacted and I could not get stent or even a guidewire past the stone even under direct visualization using flexible ureteroscopy. I therefore performed lithotripsy that same day on the impacted distal ureteral stone with what appeared to be good breakup of the stone at the time of the procedure. The second stone was treated with ESWL on 10/28/08 with excellent break up.   He then had a distal right ureteral stone treated with ESL in 9/13.    Interval history: CT scan 03/30/14 revealed a 4 mm distal left ureteral stone and a 19 mm right UPJ stone. The stone on the left side was treated ureteroscopically and a stent was left in place and a double-J stent was placed on the right-hand side. The right-sided stone has Hounsfield units of 1400. This didn't have been bothering him. He is having some urgency and almost urge incontinence. He's been taking Pyridium but this is not in helping much with this.   Past Medical History Problems  1. History of Anxiety (300.00) 2. History of Calculus of ureter (592.1) 3. History of Heartburn With Regurgitation (787.1) 4. History of hypertension (V12.59)  Surgical History Problems  1. History of Cystoscopy With Ureteroscopy Right 2. History of Hemorrhoidectomy 3. History of Lithotripsy 4. History of Lithotripsy 5. History of Lithotripsy 6. History of Lithotripsy 7. History of Lithotripsy  Current  Meds 1. Flomax 0.4 MG Oral Capsule;  Therapy: (Recorded:18Sep2015) to Recorded 2. Zoloft TABS;  Therapy: (Recorded:29Mar2010) to Recorded  Allergies Medication  1. No Known Drug Allergies  Family History Problems  1. Family history of Family Health Status Number Of Children   1 child  Social History Problems  1. Alcohol Use 2. Caffeine Use   3 cups a day 3. Former Smoker   1 pack per day 4. Marital History - Currently Married  Review of Systems Genitourinary and gastrointestinal system(s) were reviewed and pertinent findings if present are noted.  Genitourinary: no nocturia.    Vitals Vital Signs [Data Includes: Last 1 Day]  Recorded: 29Sep2015 01:36PM  Blood Pressure: 123 / 81 Temperature: 97.2 F Heart Rate: 71  Results/Data Urine [Data Includes: Last 1 Day]   29Sep2015  COLOR RED   APPEARANCE CLOUDY   SPECIFIC GRAVITY 1.020   pH 6.5   GLUCOSE NEG mg/dL  BILIRUBIN SMALL   KETONE NEG mg/dL  BLOOD LARGE   PROTEIN > 300 mg/dL  UROBILINOGEN 0.2 mg/dL  NITRITE NEG   LEUKOCYTE ESTERASE MOD   SQUAMOUS EPITHELIAL/HPF RARE   WBC 7-10 WBC/hpf  RBC TNTC RBC/hpf  BACTERIA MODERATE   CRYSTALS NONE SEEN   CASTS NONE SEEN    The following images/tracing/specimen were independently visualized:  KUB: His stents are in good position. I see no stones along the stent on the left-hand side. The stone in his right renal pelvis appears unchanged. It  is irregular in contour.  The following clinical lab reports were reviewed:  UA: Cellular elements were noted but it did not appear infected.    Procedure    Procedure: Cystoscopy / Stent Removal1   After consent was obtained, the patient was placed in the supine position and prepped in the usual fashion. Flexible cystourethroscopy was performed and the indwelling left ureteral stent was identified. The flexible grasping forceps were then used to remove the stent without difficulty. The patient tolerated the procedure well  and without complications.1 .      1 Amended By: Kathie Rhodes; Apr 07 2014 6:07 PM EST  Assessment Assessed  1. Calculus of right ureter (592.1) 2. Calculus of left ureter (592.1) 3. Bilateral kidney stones (592.0)    We discussed the management of urinary stones. These options include observation, ureteroscopy, shockwave lithotripsy, and PCNL. We discussed which options are relevant to these particular stones. We discussed the natural history of stones as well as the complications of untreated stones and the impact on quality of life without treatment as well as with each of the above listed treatments. We also discussed the efficacy of each treatment in its ability to clear the stone burden. With any of these management options I discussed the signs and symptoms of infection and the need for emergent treatment should these be experienced. For each option we discussed the ability of each procedure to clear the patient of their stone burden.    For observation I described the risks which include but are not limited to silent renal damage, life-threatening infection, need for emergent surgery, failure to pass stone, and pain.    For ureteroscopy I described the risks which include heart attack, stroke, pulmonary embolus, death, bleeding, infection, damage to contiguous structures, positioning injury, ureteral stricture, ureteral avulsion, ureteral injury, need for ureteral stent, inability to perform ureteroscopy, need for an interval procedure, inability to clear stone burden, stent discomfort and pain.    For shockwave lithotripsy I described the risks which include arrhythmia, kidney contusion, kidney hemorrhage, need for transfusion, long-term risk of diabetes or hypertension, back discomfort, flank ecchymosis, flank abrasion, inability to break up stone, inability to pass stone fragments, Steinstrasse, infection associated with obstructing stones, need for different surgical procedure,  need for repeat shockwave lithotripsy, and death.    For PCNL I described the risks including heart attack, sure, pulmonary embolus, death, positioning injury, pneumothorax, hydrothorax, need for chest tube, inability to clear stone burden, renal laceration, arterial venous fistula or malformation, need for embolization of kidney, loss of kidney or renal function, need for repeat procedure, need for prolonged nephrostomy tube, ureteral avulsion and fistula.    After discussing all the options he has elected to proceed with the ESL. He has a stent in which is good because he has a fairly large volume of stone. We did discuss the possible need for a repeat treatment if all stone does not fragment completely. He understands this and has elected to proceed. Because I will be out of town I have reviewed the case with Dr. Louis Meckel who has graciously agreed to perform this procedure in order so that he can get his stent out sooner.   Plan Calculus of left ureter  1. Cysto Removal JJ(s); Status:Hold For - Appointment,Date of Service; Requested  for:29Sep2015;  Calculus of right ureter  2. Start: Hydrocodone-Acetaminophen 10-325 MG Oral Tablet; TAKE 1 TO 2 TABLETS  EVERY 6 HOURS AS NEEDED 3. Start: VESIcare 10 MG Oral Tablet; Take  1 tablet daily 4. Follow-up Schedule Surgery Office  Schedule this lithotripsy with Dr. Louis Meckel  Status: Hold  For - Appointment  Requested for: 29Sep2015 Health Maintenance  5. UA With REFLEX; [Do Not Release]; Status:Complete;   Done: [id number] 01:04PM    1. I gave him samples of Vesicare.    2. His left ureteral stent was removed today.  3. He'll be scheduled for lithotripsy of his right renal calculus.

## 2014-04-12 NOTE — Op Note (Signed)
See [facility] note scanned into chart. 

## 2014-04-12 NOTE — Discharge Instructions (Signed)
See [facility] discharge instructions in chart.  

## 2014-05-13 ENCOUNTER — Other Ambulatory Visit: Payer: Self-pay | Admitting: Family Medicine

## 2014-05-14 ENCOUNTER — Other Ambulatory Visit: Payer: Self-pay | Admitting: *Deleted

## 2014-05-14 MED ORDER — SERTRALINE HCL 100 MG PO TABS: 100.0000 mg | ORAL_TABLET | Freq: Every morning | ORAL | Status: DC

## 2014-05-14 NOTE — Telephone Encounter (Signed)
Last seen 09/01/13  [name]

## 2014-05-14 NOTE — Telephone Encounter (Signed)
Patient last seen in office on 09-01-13 for an acute visit. Last chronic follow up was 12-31-12. Please advise on refill

## 2014-05-27 ENCOUNTER — Other Ambulatory Visit: Payer: Self-pay | Admitting: Family Medicine

## 2014-05-27 ENCOUNTER — Telehealth: Payer: Self-pay | Admitting: Family Medicine

## 2014-06-22 ENCOUNTER — Other Ambulatory Visit: Payer: Self-pay | Admitting: Family Medicine

## 2014-07-14 ENCOUNTER — Other Ambulatory Visit: Payer: Self-pay | Admitting: Family Medicine

## 2014-08-24 ENCOUNTER — Other Ambulatory Visit: Payer: Self-pay | Admitting: Family Medicine

## 2014-08-25 ENCOUNTER — Telehealth: Payer: Self-pay | Admitting: Family Medicine

## 2014-08-25 MED ORDER — SERTRALINE HCL 100 MG PO TABS
ORAL_TABLET | ORAL | Status: DC
Start: 2014-08-25 — End: 2014-12-08

## 2014-08-25 MED ORDER — AMLODIPINE BESY-BENAZEPRIL HCL 5-10 MG PO CAPS: ORAL_CAPSULE | ORAL | Status: DC

## 2014-08-25 NOTE — Telephone Encounter (Signed)
done

## 2014-08-27 ENCOUNTER — Ambulatory Visit: Payer: Self-pay | Admitting: Family Medicine

## 2014-09-02 ENCOUNTER — Telehealth: Payer: Self-pay | Admitting: Family Medicine

## 2014-09-02 ENCOUNTER — Ambulatory Visit: Payer: Self-pay | Admitting: Family Medicine

## 2014-09-07 NOTE — Telephone Encounter (Signed)
Appointment scheduled for 3/17 at 4:30 with Ronnald Collum.

## 2014-09-23 ENCOUNTER — Ambulatory Visit: Payer: 59 | Admitting: Nurse Practitioner

## 2014-10-04 ENCOUNTER — Encounter: Payer: Self-pay | Admitting: Gastroenterology

## 2014-10-05 ENCOUNTER — Encounter: Payer: Self-pay | Admitting: Gastroenterology

## 2014-12-08 ENCOUNTER — Encounter: Payer: Self-pay | Admitting: Physician Assistant

## 2014-12-08 ENCOUNTER — Ambulatory Visit (INDEPENDENT_AMBULATORY_CARE_PROVIDER_SITE_OTHER): Payer: PRIVATE HEALTH INSURANCE | Admitting: Physician Assistant

## 2014-12-08 VITALS — BP 123/79 | HR 71 | Temp 97.8°F | Ht 70.0 in | Wt 180.0 lb

## 2014-12-08 DIAGNOSIS — F411 Generalized anxiety disorder: Secondary | ICD-10-CM

## 2014-12-08 DIAGNOSIS — I10 Essential (primary) hypertension: Secondary | ICD-10-CM

## 2014-12-08 DIAGNOSIS — N200 Calculus of kidney: Secondary | ICD-10-CM

## 2014-12-08 MED ORDER — TAMSULOSIN HCL 0.4 MG PO CAPS: 0.4000 mg | ORAL_CAPSULE | ORAL | Status: DC

## 2014-12-08 MED ORDER — SERTRALINE HCL 100 MG PO TABS: ORAL_TABLET | ORAL | Status: DC

## 2014-12-08 MED ORDER — AMLODIPINE BESY-BENAZEPRIL HCL 5-10 MG PO CAPS: ORAL_CAPSULE | ORAL | Status: DC

## 2014-12-08 NOTE — Patient Instructions (Signed)

## 2014-12-08 NOTE — Progress Notes (Signed)
Subjective:     Patient ID: [name] [name], male   DOB: [birth year], 49 y.o.   MRN: [id number]  HPI Pt here for recheck of his HTN, anxiety, and renal stones He states he has been doing well Denies CP,SOB, change in endurance, or lower ext edema He has regular f/u with his urologist regarding his kidney stones Pt with hx of anxiety/panic attacks and does well on the Zoloft   Review of Systems  Constitutional: Negative for activity change, appetite change and fatigue.  HENT: Negative.   Eyes: Negative.   Respiratory: Negative.   Genitourinary: Negative.        Objective:   Physical Exam  Constitutional: He appears well-developed and well-nourished.  HENT:  Mouth/Throat: Oropharynx is clear and moist. No oropharyngeal exudate.  Neck: Neck supple. No JVD present.  No bruits  Cardiovascular: Normal rate, normal heart sounds and intact distal pulses.   Pulmonary/Chest: Effort normal and breath sounds normal.  Musculoskeletal: He exhibits no edema.  Lymphadenopathy:    He has no cervical adenopathy.  Psychiatric: He has a normal mood and affect. His behavior is normal. Judgment and thought content normal.  Nursing note and vitals reviewed.      Assessment:     HTN Anxiety Renal stones    Plan:     Full labs pending- will inform of results Pt up to date on colonoscopy PSA added to labs Recheck in 6 months

## 2014-12-09 LAB — PSA, TOTAL AND FREE
PSA FREE PCT: 7.2 %
PSA, Free: 0.23 ng/mL
Prostate Specific Ag, Serum: 3.2 ng/mL (ref 0.0–4.0)

## 2014-12-09 LAB — CMP14+EGFR
ALT: 18 IU/L (ref 0–44)
AST: 20 IU/L (ref 0–40)
Albumin/Globulin Ratio: 1.7 (ref 1.1–2.5)
Albumin: 4.1 g/dL (ref 3.5–5.5)
Alkaline Phosphatase: 87 IU/L (ref 39–117)
BILIRUBIN TOTAL: 0.3 mg/dL (ref 0.0–1.2)
BUN/Creatinine Ratio: 13 (ref 9–20)
BUN: 11 mg/dL (ref 6–24)
CO2: 26 mmol/L (ref 18–29)
Calcium: 9 mg/dL (ref 8.7–10.2)
Chloride: 101 mmol/L (ref 97–108)
Creatinine, Ser: 0.87 mg/dL (ref 0.76–1.27)
GFR calc Af Amer: 116 mL/min/{1.73_m2} (ref >59)
GFR calc non Af Amer: 101 mL/min/{1.73_m2} (ref >59)
GLOBULIN, TOTAL: 2.4 g/dL (ref 1.5–4.5)
GLUCOSE: 86 mg/dL (ref 65–99)
Potassium: 4.6 mmol/L (ref 3.5–5.2)
SODIUM: 141 mmol/L (ref 134–144)
Total Protein: 6.5 g/dL (ref 6.0–8.5)

## 2015-05-06 IMAGING — CR DG ABDOMEN 1V
1 series · 1 of 1 positions shown · non-contrast
Comparison: Abdominal film April 06, 2014

CLINICAL DATA: Pre lithotripsy examination; history of urinary
tract stones as well as stone extraction and stent placement

EXAM:
ABDOMEN - 1 VIEW

[t abdomen supine]
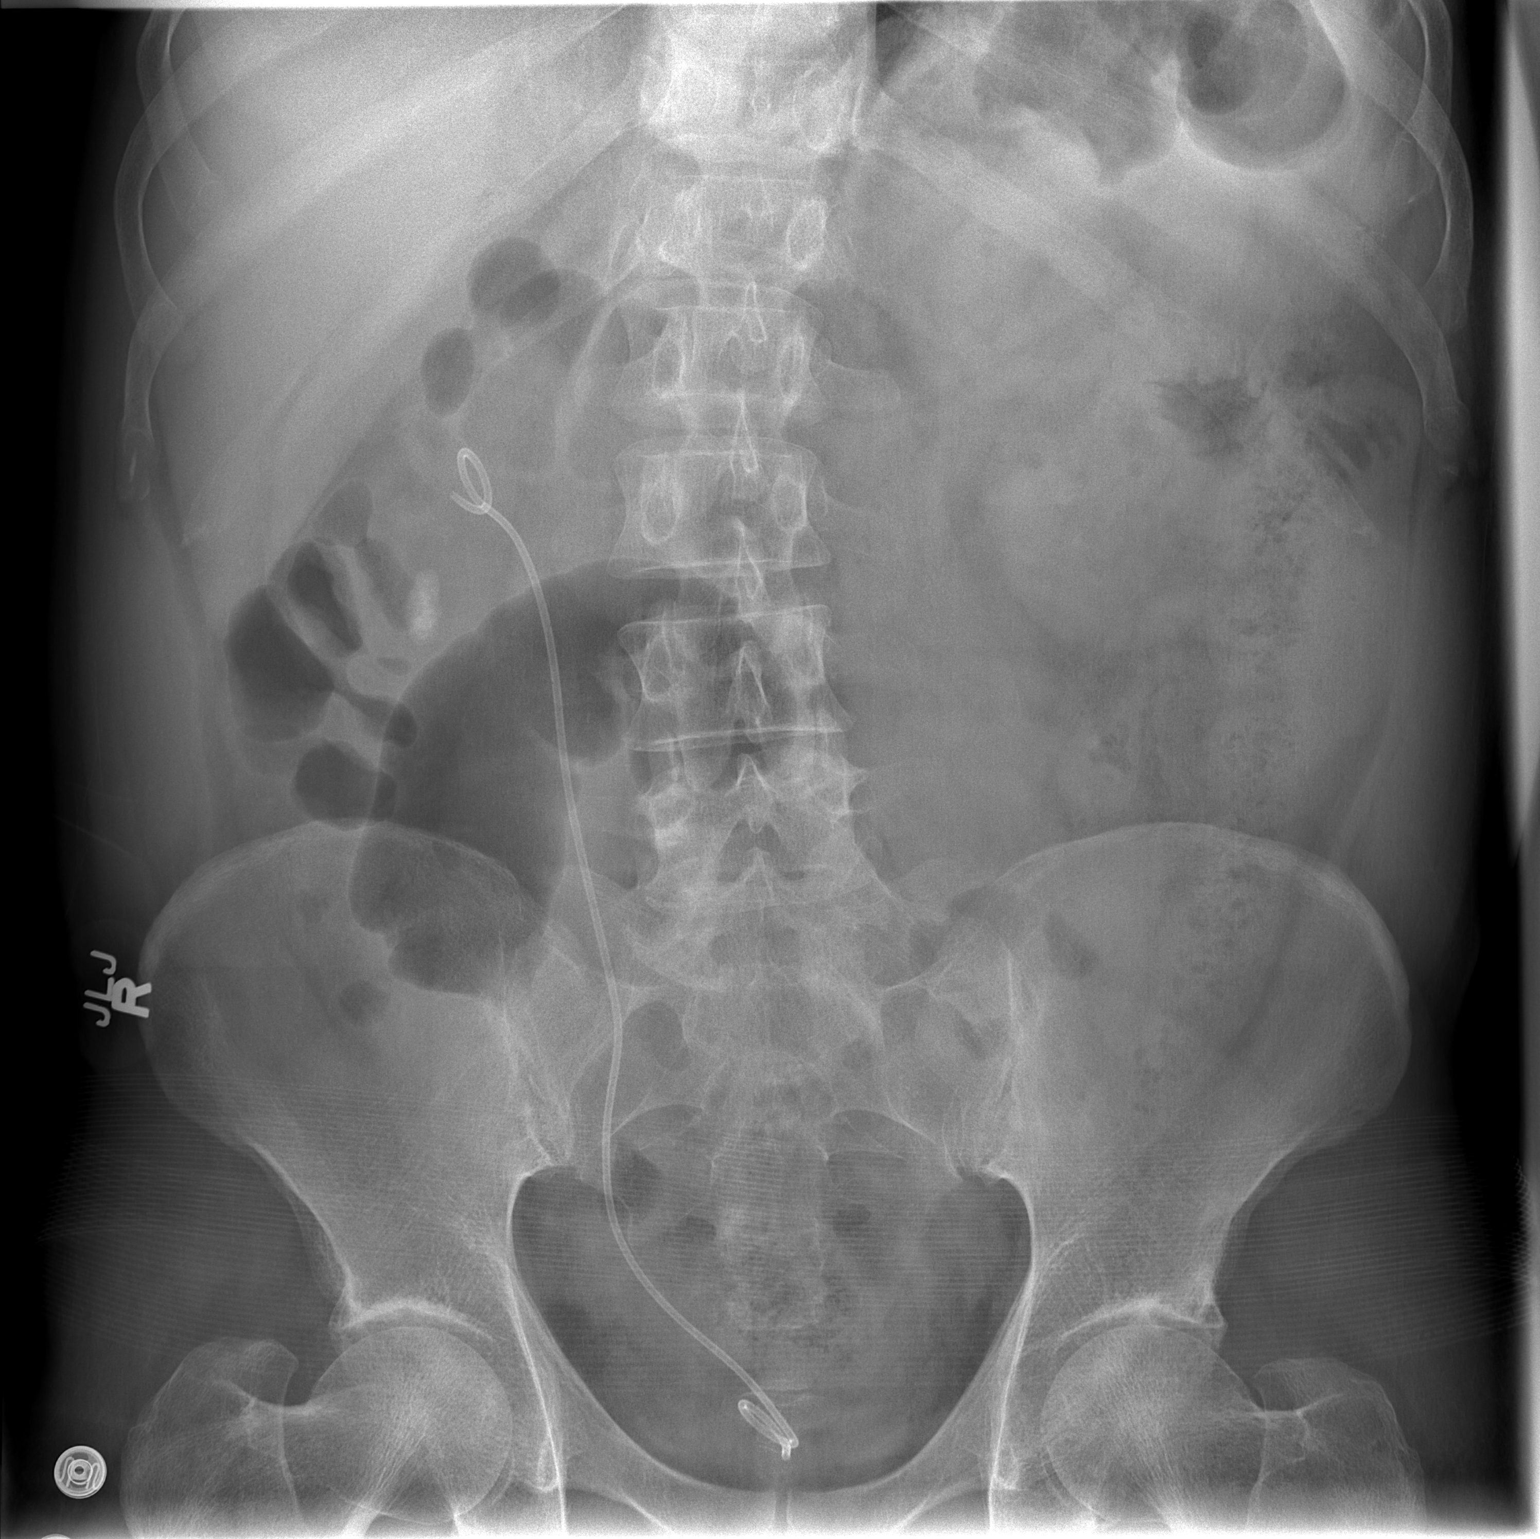

[1 of 1 positions shown; findings below may reference images not displayed]

FINDINGS: The ureteral stent on the left is no longer present. On the right
the stent is in place with the proximal pigtail in the renal pelvis.
A large stone projects over the right lower pole measuring 2.1 x
cm. The distal coil of the stent lies in the midline projecting over
the coccyx. No stones are evident on the left. The bowel gas pattern
is unremarkable. There is stable gentle dextrocurvature of the mid
lumbar spine.
IMPRESSION: There is stable appearance of the right ureteral stent. A large
stable appearing calcified stone projects over the lower pole of the
right kidney.

## 2015-06-01 ENCOUNTER — Ambulatory Visit (INDEPENDENT_AMBULATORY_CARE_PROVIDER_SITE_OTHER): Payer: PRIVATE HEALTH INSURANCE | Admitting: Pediatrics

## 2015-06-01 ENCOUNTER — Encounter: Payer: Self-pay | Admitting: Pediatrics

## 2015-06-01 VITALS — BP 129/83 | HR 68 | Temp 97.5°F | Ht 70.0 in | Wt 184.6 lb

## 2015-06-01 DIAGNOSIS — R1030 Lower abdominal pain, unspecified: Secondary | ICD-10-CM | POA: Diagnosis not present

## 2015-06-01 LAB — POCT URINALYSIS DIPSTICK
Bilirubin, UA: NEGATIVE
Glucose, UA: NEGATIVE
Ketones, UA: NEGATIVE
Leukocytes, UA: NEGATIVE
Nitrite, UA: NEGATIVE
PH UA: 6.5
Protein, UA: NEGATIVE
Spec Grav, UA: 1.015
Urobilinogen, UA: NEGATIVE

## 2015-06-01 LAB — POCT UA - MICROSCOPIC ONLY
CASTS, UR, LPF, POC: NEGATIVE
CRYSTALS, UR, HPF, POC: NEGATIVE
MUCUS UA: NEGATIVE
WBC, Ur, HPF, POC: NEGATIVE
YEAST UA: NEGATIVE

## 2015-06-01 MED ORDER — OXYCODONE-ACETAMINOPHEN 5-325 MG PO TABS: 1.0000 | ORAL_TABLET | Freq: Four times a day (QID) | ORAL | Status: DC | PRN

## 2015-06-01 NOTE — Progress Notes (Signed)
    Subjective:    Patient ID: [name] [name], male    DOB: [birth year], 49 y.o.   MRN: [id number]  CC: back pain  HPI: [name] [name] is a 49 y.o. male presenting on 06/01/2015 for Back Pain and Groin Pain  Started two days ago Pain comes and goes in intensity HAs ahd over 40 Kidney stones in his life His son also has kidney stones No fevers, no dysuria, has back pain that radiates to R groin Feels like prior kidney stones do No erythema, swelling or pain in testicles.   Relevant past medical, surgical, family and social history reviewed and updated as indicated. Interim medical history since our last visit reviewed. Allergies and medications reviewed and updated.   ROS: Per HPI unless specifically indicated above  Past Medical History Patient Active Problem List   Diagnosis Date Noted  . Internal strangulated hemorrhoids 09/02/2013  . Bleeding internal hemorrhoids 01/12/2013  . BLOOD IN STOOL 03/24/2009    Current Outpatient Prescriptions  Medication Sig Dispense Refill  . amLODipine-benazepril (LOTREL) 5-10 MG per capsule TAKE 1 CAPSULE BY MOUTH EVERY MORNING. 30 capsule 6  . sertraline (ZOLOFT) 100 MG tablet TAKE 1 TABLET (100 MG TOTAL) BY MOUTH EVERY MORNING. 30 tablet 6  . tamsulosin (FLOMAX) 0.4 MG CAPS capsule Take 1 capsule (0.4 mg total) by mouth every morning. 30 capsule 6  . oxyCODONE-acetaminophen (ROXICET) 5-325 MG tablet Take 1-2 tablets by mouth every 6 (six) hours as needed for severe pain. 30 tablet 0   No current facility-administered medications for this visit.       Objective:    BP 129/83 mmHg  Pulse 68  Temp(Src) 97.5 F (36.4 C) (Oral)  Ht 5\' 10"  (1.778 m)  Wt 184 lb 9.6 oz (83.734 kg)  BMI 26.49 kg/m2  Wt Readings from Last 3 Encounters:  06/01/15 184 lb 9.6 oz (83.734 kg)  12/08/14 180 lb (81.647 kg)  04/12/14 176 lb 12.8 oz (80.196 kg)     Gen: NAD, alert, cooperative with exam, NCAT, appears uncomfortable EYES: EOMI, no scleral  injection or icterus ENT:  MMM CV: NRRR, normal S1/S2, no murmur, distal pulses 2+ b/l Resp: CTABL, no wheezes, normal WOB Abd: +BS, soft, NTND. no guarding or organomegaly, no CVA tenderness Ext: No edema, warm Neuro: Alert and oriented, strength equal b/l UE and LE, coordination grossly normal MSK: normal muscle bulk     Assessment & Plan:   Kendrew was seen today for back pain and groin pain, feels like prior kidney stones. Is followed by urology. UA with no infection cells, does have 1-3 RBCs. Possibly repeat kidney stone, gave below for pain management, if any further is needed will need to be seen. Pt will call Monday for urology appt if has not passed yet. Offered to order CT scan, pt wants to wait until Monday. Drink plenty of fluids. Already on flomax. Return precautions given, if any fevers over the weekend needs to go to ED.  Diagnoses and all orders for this visit:  Groin pain, unspecified laterality -     POCT UA - Microscopic Only -     POCT urinalysis dipstick -     oxyCODONE-acetaminophen (ROXICET) 5-325 MG tablet; Take 1-2 tablets by mouth every 6 (six) hours as needed for severe pain.  Follow up plan: Return if symptoms worsen or fail to improve.  Assunta Found, MD [facility] 06/01/2015, 4:59 PM

## 2015-09-07 ENCOUNTER — Emergency Department (HOSPITAL_COMMUNITY)
Admission: EM | Admit: 2015-09-07 | Discharge: 2015-09-08 | Disposition: A | Discharge status: Home / Self Care | Payer: No Typology Code available for payment source | Attending: Emergency Medicine | Admitting: Emergency Medicine

## 2015-09-07 ENCOUNTER — Encounter (HOSPITAL_COMMUNITY): Payer: Self-pay | Admitting: Emergency Medicine

## 2015-09-07 DIAGNOSIS — F1010 Alcohol abuse, uncomplicated: Secondary | ICD-10-CM | POA: Insufficient documentation

## 2015-09-07 DIAGNOSIS — F17210 Nicotine dependence, cigarettes, uncomplicated: Secondary | ICD-10-CM | POA: Insufficient documentation

## 2015-09-07 DIAGNOSIS — R1031 Right lower quadrant pain: Secondary | ICD-10-CM | POA: Diagnosis present

## 2015-09-07 DIAGNOSIS — N189 Chronic kidney disease, unspecified: Secondary | ICD-10-CM | POA: Insufficient documentation

## 2015-09-07 DIAGNOSIS — F329 Major depressive disorder, single episode, unspecified: Secondary | ICD-10-CM | POA: Insufficient documentation

## 2015-09-07 DIAGNOSIS — I129 Hypertensive chronic kidney disease with stage 1 through stage 4 chronic kidney disease, or unspecified chronic kidney disease: Secondary | ICD-10-CM | POA: Insufficient documentation

## 2015-09-07 DIAGNOSIS — R319 Hematuria, unspecified: Secondary | ICD-10-CM | POA: Insufficient documentation

## 2015-09-07 DIAGNOSIS — Z79899 Other long term (current) drug therapy: Secondary | ICD-10-CM | POA: Diagnosis not present

## 2015-09-07 DIAGNOSIS — Z87442 Personal history of urinary calculi: Secondary | ICD-10-CM | POA: Insufficient documentation

## 2015-09-07 MED ORDER — HYDROMORPHONE HCL 1 MG/ML IJ SOLN
1.0000 mg | Freq: Once | INTRAMUSCULAR | Status: AC
  Administered 2015-09-07: 1 mg via INTRAVENOUS
  Filled 2015-09-07: qty 1

## 2015-09-07 MED ORDER — ONDANSETRON HCL 4 MG/2ML IJ SOLN
4.0000 mg | Freq: Once | INTRAMUSCULAR | Status: AC
  Administered 2015-09-07: 4 mg via INTRAVENOUS
  Filled 2015-09-07: qty 2

## 2015-09-07 NOTE — ED Provider Notes (Signed)
CSN: [id number]     Arrival date & time 09/07/15  2253 History  By signing my name below, I, [name] [name], attest that this documentation has been prepared under the direction and in the presence of [name] Fraise, MD. Electronically Signed: Doran [name], ED Scribe. 09/07/2015. 11:25 PM.  Chief Complaint  Patient presents with  . Groin Pain   Patient is a 50 y.o. male presenting with abdominal pain. The history is provided by the patient. No language interpreter was used.  Abdominal Pain Pain location:  RLQ Pain radiates to:  Does not radiate Pain severity:  Mild Onset quality:  Sudden Duration:  1 hour Timing:  Constant Progression:  Unchanged Chronicity:  Recurrent Relieved by:  Nothing Worsened by:  Nothing tried Ineffective treatments:  None tried Associated symptoms: no cough, no dysuria, no fever and no vomiting    HPI Comments: [name] [name] is a 50 y.o. male with a PMHx HTN, kidney stones, and ureteral stents who presents to the Emergency Department complaining of RLQ abdominal pain that began 1 hour ago. Pt reports history of kidney stones that required surgical intervention. No known drug allergies.   Past Medical History  Diagnosis Date  . Hypertension   . Depression   . Anxiety   . Chronic kidney disease     kidney stones  . Bulging discs   . Kidney stones    Past Surgical History  Procedure Laterality Date  . Lithotripsy    . Cystoscopy w/ ureteral stent placement  09-2011  . Cystoscopy w/ ureteral stent removal  10-2011  . Hemorrhoid surgery N/A 09/03/2013    Procedure: HEMORRHOIDECTOMY;  Surgeon: Leighton Ruff, MD;  Location: [facility];  Service: General;  Laterality: N/A;  . Cystoscopy with retrograde pyelogram, ureteroscopy and stent placement Bilateral 03/26/2014    Procedure: CYSTOSCOPY WITH RETROGRADE PYELOGRAM, URETEROSCOPY AND STENT PLACEMENT;  Surgeon: Bernestine Amass, MD;  Location: [facility];  Service: Urology;  Laterality: Bilateral;  . Holmium laser  application Bilateral [id number]    Procedure: HOLMIUM LASER APPLICATION;  Surgeon: Bernestine Amass, MD;  Location: [facility];  Service: Urology;  Laterality: Bilateral;  . Stone extraction with basket Left 03/26/2014    Procedure: STONE EXTRACTION WITH BASKET;  Surgeon: Bernestine Amass, MD;  Location: [facility];  Service: Urology;  Laterality: Left;   Family History  Problem Relation Age of Onset  . ALS Father    Social History  Substance Use Topics  . Smoking status: Current Every Day Smoker -- 0.50 packs/day for 20 years    Types: Cigarettes    Start date: 12/31/1992  . Smokeless tobacco: Never Used  . Alcohol Use: Yes     Comment: occasional-1-2 drinks every 2 weeks    Review of Systems  Constitutional: Negative for fever.  Respiratory: Negative for cough.   Gastrointestinal: Positive for abdominal pain. Negative for vomiting.  Genitourinary: Negative for dysuria.  All other systems reviewed and are negative.  Allergies  Review of patient's allergies indicates no known allergies.  Home Medications   Prior to Admission medications   Medication Sig Start Date End Date Taking? Authorizing Provider  amLODipine-benazepril (LOTREL) 5-10 MG per capsule TAKE 1 CAPSULE BY MOUTH EVERY MORNING. 12/08/14   Lodema Pilot, PA-C  oxyCODONE-acetaminophen (ROXICET) 5-325 MG tablet Take 1-2 tablets by mouth every 6 (six) hours as needed for severe pain. 06/01/15   Eustaquio Maize, MD  sertraline (ZOLOFT) 100 MG tablet TAKE 1 TABLET (100 MG TOTAL) BY MOUTH  EVERY MORNING. 12/08/14   Lodema Pilot, PA-C  tamsulosin (FLOMAX) 0.4 MG CAPS capsule Take 1 capsule (0.4 mg total) by mouth every morning. 12/08/14   Livingston Diones Webster, PA-C   BP 147/93 mmHg  Pulse 71  Temp(Src) 98 F (36.7 C)  Resp 18  Ht 5\' 10"  (1.778 m)  Wt 175 lb (79.379 kg)  BMI 25.11 kg/m2  SpO2 99% Physical Exam   CONSTITUTIONAL: Well developed/well nourished, uncomfortable appearing HEAD: Normocephalic/atraumatic EYES:  EOMI/PERRL ENMT: Mucous membranes moist NECK: supple no meningeal signs SPINE/BACK:entire spine nontender CV: S1/S2 noted, no murmurs/rubs/gallops noted LUNGS: Lungs are clear to auscultation bilaterally, no apparent distress ABDOMEN: soft, moderate RLQ tenderness, no rebound or guarding, bowel sounds noted throughout abdomen GU:no cva tenderness, no inguinal hernia notes, mild tenderness to right testicle, no overlying erythema or edema, testicle are descended bilaterally, chaperone present NEURO: Pt is awake/alert/appropriate, moves all extremitiesx4.  No facial droop.   EXTREMITIES: pulses normal/equal, full ROM SKIN: warm, color normal PSYCH: no abnormalities of mood noted, alert and oriented to situation  ED Course  Procedures  DIAGNOSTIC STUDIES: Oxygen Saturation is 99% on room air, normal by my interpretation.    COORDINATION OF CARE: 11:21 PM Will order urinalysis. Discussed treatment plan with pt at bedside and pt agreed to plan. 1:05 AM Pt improved, he is noted to have hematuria and reports this feels like previous ureteral stones Will d/c home as he feels well and would like to f/u with urology Will defer imaging for now We discussed return precautions  Labs Review Labs Reviewed  URINALYSIS, ROUTINE W REFLEX MICROSCOPIC (NOT AT [facility]) - Abnormal; Notable for the following:    Hgb urine dipstick LARGE (*)    Protein, ur 100 (*)    All other components within normal limits  URINE MICROSCOPIC-ADD ON - Abnormal; Notable for the following:    Squamous Epithelial / LPF 0-5 (*)    Bacteria, UA FEW (*)    All other components within normal limits     I have personally reviewed and evaluated these lab results as part of my medical decision-making.   Medications  oxyCODONE-acetaminophen (PERCOCET/ROXICET) 5-325 MG per tablet 1 tablet (not administered)  HYDROmorphone (DILAUDID) injection 1 mg (1 mg Intravenous Given 09/07/15 2358)  ondansetron (ZOFRAN) injection 4 mg (4 mg  Intravenous Given 09/07/15 2358)    MDM   Final diagnoses:  Hematuria  Groin pain, right    Nursing notes including past medical history and social history reviewed and considered in documentation Labs/vital reviewed myself and considered during evaluation    I personally performed the services described in this documentation, which was scribed in my presence. The recorded information has been reviewed and is accurate.       [name] Fraise, MD 09/08/15 [phone]

## 2015-09-07 NOTE — ED Notes (Signed)
Pt c/o groin pain x one hour. Pt states it feels like a kidney stone.

## 2015-09-08 ENCOUNTER — Encounter: Payer: Self-pay | Admitting: Family Medicine

## 2015-09-08 ENCOUNTER — Ambulatory Visit (INDEPENDENT_AMBULATORY_CARE_PROVIDER_SITE_OTHER): Payer: PRIVATE HEALTH INSURANCE | Admitting: Family Medicine

## 2015-09-08 DIAGNOSIS — R1030 Lower abdominal pain, unspecified: Secondary | ICD-10-CM | POA: Diagnosis not present

## 2015-09-08 LAB — URINALYSIS, ROUTINE W REFLEX MICROSCOPIC
BILIRUBIN URINE: NEGATIVE
GLUCOSE, UA: NEGATIVE mg/dL
KETONES UR: NEGATIVE mg/dL
Leukocytes, UA: NEGATIVE
Nitrite: NEGATIVE
PROTEIN: 100 mg/dL — AB
Specific Gravity, Urine: 1.025 (ref 1.005–1.030)
pH: 6 (ref 5.0–8.0)

## 2015-09-08 LAB — POCT URINALYSIS DIPSTICK
Bilirubin, UA: NEGATIVE
Glucose, UA: NEGATIVE
Ketones, UA: NEGATIVE
NITRITE UA: NEGATIVE
PH UA: 6
Spec Grav, UA: 1.025
UROBILINOGEN UA: NEGATIVE

## 2015-09-08 LAB — POCT UA - MICROSCOPIC ONLY
CASTS, UR, LPF, POC: NEGATIVE
Crystals, Ur, HPF, POC: NEGATIVE
Mucus, UA: NEGATIVE
YEAST UA: NEGATIVE

## 2015-09-08 LAB — URINE MICROSCOPIC-ADD ON

## 2015-09-08 MED ORDER — OXYCODONE-ACETAMINOPHEN 5-325 MG PO TABS: 1.0000 | ORAL_TABLET | Freq: Three times a day (TID) | ORAL | Status: DC | PRN

## 2015-09-08 MED ORDER — PROMETHAZINE HCL 25 MG PO TABS: 25.0000 mg | ORAL_TABLET | Freq: Four times a day (QID) | ORAL | Status: DC | PRN

## 2015-09-08 MED ORDER — OXYCODONE-ACETAMINOPHEN 5-325 MG PO TABS
1.0000 | ORAL_TABLET | Freq: Once | ORAL | Status: AC
  Administered 2015-09-08: 1 via ORAL
  Filled 2015-09-08: qty 1

## 2015-09-08 MED ORDER — HYDROCODONE-ACETAMINOPHEN 5-325 MG PO TABS: 1.0000 | ORAL_TABLET | Freq: Four times a day (QID) | ORAL | Status: DC | PRN

## 2015-09-08 NOTE — Progress Notes (Signed)
   Subjective:    Patient ID: [name] [name], male    DOB: 1966-05-09, 50 y.o.   MRN: [id number]  HPI Patient here today for possible kidney stone and constant Headache. Patient has a history of multiple stones some of which passed spontaneously and some had to be retrieved. He's also had lithotripsy. Pain is typical of what he has had before especially in the groin and left testicle/scrotum. He's had some nausea and sweats with the pain. No difficulty with voiding or with fever.     Patient Active Problem List   Diagnosis Date Noted  . Internal strangulated hemorrhoids 09/02/2013  . Bleeding internal hemorrhoids 01/12/2013  . BLOOD IN STOOL 03/24/2009   Outpatient Encounter Prescriptions as of 09/08/2015  Medication Sig  . amLODipine-benazepril (LOTREL) 5-10 MG per capsule TAKE 1 CAPSULE BY MOUTH EVERY MORNING.  . promethazine (PHENERGAN) 25 MG tablet Take 1 tablet (25 mg total) by mouth every 6 (six) hours as needed for nausea or vomiting.  . sertraline (ZOLOFT) 100 MG tablet TAKE 1 TABLET (100 MG TOTAL) BY MOUTH EVERY MORNING.  Marland Kitchen oxyCODONE-acetaminophen (PERCOCET/ROXICET) 5-325 MG tablet Take 1 tablet by mouth every 8 (eight) hours as needed for severe pain. (Patient not taking: Reported on 09/08/2015)   No facility-administered encounter medications on file as of 09/08/2015.      Review of Systems  Constitutional: Negative.   Eyes: Negative.   Respiratory: Negative.   Cardiovascular: Negative.   Gastrointestinal: Negative.   Endocrine: Negative.   Genitourinary: Negative.        Groin pain and pressure  Musculoskeletal: Negative.   Skin: Negative.   Allergic/Immunologic: Negative.   Neurological: Positive for headaches.  Hematological: Negative.   Psychiatric/Behavioral: Negative.        Objective:   Physical Exam  Constitutional: He appears well-developed and well-nourished.  Abdominal: Soft. There is no guarding.  No CVAT  Psychiatric: He has a normal mood and  affect. His behavior is normal.    BP 133/83 mmHg  Pulse 73  Temp(Src) 97.4 F (36.3 C) (Oral)  Ht 5\' 10"  (1.778 m)  Wt 178 lb (80.74 kg)  BMI 25.54 kg/m2       Assessment & Plan:  1. Groin pain, unspecified laterality We represent recurrence of renal stone disease. Have asked him to push fluids given him hydrocodone for pain. If the stone does not pass or a bit has fever or inability to urinate go to the emergency room or call his urologist. - POCT UA - Microscopic Only - POCT urinalysis dipstick  Wardell Honour MD - Urine culture

## 2015-09-10 LAB — URINE CULTURE: ORGANISM ID, BACTERIA: NO GROWTH

## 2015-10-23 ENCOUNTER — Other Ambulatory Visit: Payer: Self-pay | Admitting: Physician Assistant

## 2015-12-31 ENCOUNTER — Other Ambulatory Visit: Payer: Self-pay | Admitting: Family Medicine

## 2016-01-02 NOTE — Telephone Encounter (Signed)
Pt not seen for chronic conditions in an extended amt of time No recent labs regarding HTN Need OV  Rf of Zoloft x 1 only

## 2016-02-19 ENCOUNTER — Other Ambulatory Visit: Payer: Self-pay | Admitting: Physician Assistant

## 2016-03-08 ENCOUNTER — Encounter: Payer: Self-pay | Admitting: Family Medicine

## 2016-03-08 ENCOUNTER — Ambulatory Visit (INDEPENDENT_AMBULATORY_CARE_PROVIDER_SITE_OTHER): Payer: PRIVATE HEALTH INSURANCE | Admitting: Family Medicine

## 2016-03-08 ENCOUNTER — Ambulatory Visit (INDEPENDENT_AMBULATORY_CARE_PROVIDER_SITE_OTHER): Payer: PRIVATE HEALTH INSURANCE

## 2016-03-08 VITALS — BP 122/77 | HR 69 | Temp 98.6°F | Ht 70.0 in | Wt 172.8 lb

## 2016-03-08 DIAGNOSIS — R0789 Other chest pain: Secondary | ICD-10-CM

## 2016-03-08 DIAGNOSIS — Z0000 Encounter for general adult medical examination without abnormal findings: Secondary | ICD-10-CM

## 2016-03-08 DIAGNOSIS — F411 Generalized anxiety disorder: Secondary | ICD-10-CM

## 2016-03-08 LAB — BAYER DCA HB A1C WAIVED: HB A1C: 5.6 % (ref <7.0)

## 2016-03-08 MED ORDER — SERTRALINE HCL 100 MG PO TABS: 150.0000 mg | ORAL_TABLET | Freq: Every day | ORAL | 11 refills | Status: DC

## 2016-03-08 MED ORDER — TAMSULOSIN HCL 0.4 MG PO CAPS: ORAL_CAPSULE | ORAL | 3 refills | Status: DC

## 2016-03-08 MED ORDER — AMLODIPINE BESY-BENAZEPRIL HCL 5-10 MG PO CAPS: 1.0000 | ORAL_CAPSULE | Freq: Every morning | ORAL | 3 refills | Status: DC

## 2016-03-08 MED ORDER — SERTRALINE HCL 100 MG PO TABS: 150.0000 mg | ORAL_TABLET | Freq: Every day | ORAL | 3 refills | Status: DC

## 2016-03-08 NOTE — Patient Instructions (Addendum)
Great to see you!  I have written a referral to cardiology, they will call with an appointment.   I have increased you zoloft to 1 and a half pills once daily.  Come back in 1 month for follow up of chest pain and anxiety

## 2016-03-08 NOTE — Progress Notes (Addendum)
   HPI  Patient presents today here for physical exam with chest pain.  Patient explains that he's had left-sided chest pain that radiates to his left shoulder and left arm more frequently over the last 2 months. 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 The last 4-5 hours and has no alleviating factors. There is no associated palpitations, sweating, or shortness of breath.  He does have anxiety He's been on Zoloft for quite some time. He denies any suicidal thoughts or depression.  He denies any urinary frequency, hesitancy, or nocturia. He does not want a prostate exam today.  He has positive family history of heart disease, his brother died at the age of 11 with a MI. He is a smoker, has quit several times  PMH: Smoking status noted ROS: Per HPI  Objective: BP 122/77 (BP Location: Left Arm, Patient Position: Sitting, Cuff Size: Normal)   Pulse 69   Temp 98.6 F (37 C) (Oral)   Ht '5\' 10"'$  (1.778 m)   Wt 172 lb 12.8 oz (78.4 kg)   BMI 24.79 kg/m  Gen: NAD, alert, cooperative with exam HEENT: NCAT CV: RRR, good S1/S2, no murmur No chest wall tenderness to palpation Resp: CTABL, no wheezes, non-labored Ext: No edema, warm Neuro: Alert and oriented, No gross deficits  EKG- NSR, LVH  Assessment and plan:  Annual exam Discussed smoking Normal exam except for uncontrolled anxiety and chest pain detailed below Labs, declines PSA- has  urologist  # Atypical chest pain Most likely anxiety related, however given hypertension and family history on referring him to cardiology for consideration of a stress test EKG today is normal except for LVH Optimize treatment for anxiety CXR pending  # HTN Well controlled, continue amlodipine and benazepril  # Generalized anxiety disorder Increase Zoloft 150 mg daily Follow-up one month No suicidal or depression.    Orders Placed This Encounter  Procedures  . DG Chest 2  View    Standing Status:   Future    Standing Expiration Date:   05/08/2017    Order Specific Question:   Reason for Exam (SYMPTOM  OR DIAGNOSIS REQUIRED)    Answer:   chest pain    Order Specific Question:   Preferred imaging location?    Answer:   External  . Lipid panel  . CMP14+EGFR  . CBC with Differential  . TSH  . Bayer DCA Hb A1c Waived  . Ambulatory referral to Cardiology    Referral Priority:   Routine    Referral Type:   Consultation    Referral Reason:   Specialty Services Required    Requested Specialty:   Cardiology    Number of Visits Requested:   1  . EKG 12-Lead    Meds ordered this encounter  Medications  . sertraline (ZOLOFT) 100 MG tablet    Sig: Take 1.5 tablets (150 mg total) by mouth daily.    Dispense:  45 tablet    Refill:  11    Must be seen for any further refills    Laroy Apple, MD [facility] 03/08/2016, 2:59 PM

## 2016-03-08 NOTE — Addendum Note (Signed)
Addended by: Timmothy Euler on: 03/08/2016 03:10 PM   Modules accepted: Orders

## 2016-03-09 ENCOUNTER — Telehealth: Payer: Self-pay | Admitting: Family Medicine

## 2016-03-09 LAB — CMP14+EGFR
ALBUMIN: 4.4 g/dL (ref 3.5–5.5)
ALT: 16 IU/L (ref 0–44)
AST: 19 IU/L (ref 0–40)
Albumin/Globulin Ratio: 1.8 (ref 1.2–2.2)
Alkaline Phosphatase: 105 IU/L (ref 39–117)
BUN / CREAT RATIO: 21 — AB (ref 9–20)
BUN: 15 mg/dL (ref 6–24)
Bilirubin Total: 0.4 mg/dL (ref 0.0–1.2)
CO2: 25 mmol/L (ref 18–29)
CREATININE: 0.71 mg/dL — AB (ref 0.76–1.27)
Calcium: 9.1 mg/dL (ref 8.7–10.2)
Chloride: 102 mmol/L (ref 96–106)
GFR, EST AFRICAN AMERICAN: 126 mL/min/{1.73_m2} (ref >59)
GFR, EST NON AFRICAN AMERICAN: 109 mL/min/{1.73_m2} (ref >59)
GLUCOSE: 90 mg/dL (ref 65–99)
Globulin, Total: 2.5 g/dL (ref 1.5–4.5)
Potassium: 4.8 mmol/L (ref 3.5–5.2)
Sodium: 141 mmol/L (ref 134–144)
TOTAL PROTEIN: 6.9 g/dL (ref 6.0–8.5)

## 2016-03-09 LAB — CBC WITH DIFFERENTIAL/PLATELET
BASOS: 0 %
Basophils Absolute: 0 10*3/uL (ref 0.0–0.2)
EOS (ABSOLUTE): 0.2 10*3/uL (ref 0.0–0.4)
EOS: 3 %
HEMATOCRIT: 47.3 % (ref 37.5–51.0)
HEMOGLOBIN: 15.6 g/dL (ref 12.6–17.7)
Immature Grans (Abs): 0 10*3/uL (ref 0.0–0.1)
Immature Granulocytes: 0 %
LYMPHS ABS: 3.3 10*3/uL — AB (ref 0.7–3.1)
Lymphs: 43 %
MCH: 29.1 pg (ref 26.6–33.0)
MCHC: 33 g/dL (ref 31.5–35.7)
MCV: 88 fL (ref 79–97)
MONOCYTES: 10 %
Monocytes Absolute: 0.8 10*3/uL (ref 0.1–0.9)
NEUTROS ABS: 3.4 10*3/uL (ref 1.4–7.0)
Neutrophils: 44 %
Platelets: 253 10*3/uL (ref 150–379)
RBC: 5.37 x10E6/uL (ref 4.14–5.80)
RDW: 13.9 % (ref 12.3–15.4)
WBC: 7.7 10*3/uL (ref 3.4–10.8)

## 2016-03-09 LAB — LIPID PANEL
CHOLESTEROL TOTAL: 171 mg/dL (ref 100–199)
Chol/HDL Ratio: 4.1 ratio units (ref 0.0–5.0)
HDL: 42 mg/dL (ref >39)
LDL Calculated: 104 mg/dL — ABNORMAL HIGH (ref 0–99)
Triglycerides: 126 mg/dL (ref 0–149)
VLDL Cholesterol Cal: 25 mg/dL (ref 5–40)

## 2016-03-09 LAB — TSH: TSH: 1.01 u[IU]/mL (ref 0.450–4.500)

## 2016-03-09 NOTE — Telephone Encounter (Signed)
Pt aware of test results and Chest X-ray.

## 2016-04-12 ENCOUNTER — Ambulatory Visit (INDEPENDENT_AMBULATORY_CARE_PROVIDER_SITE_OTHER): Payer: No Typology Code available for payment source | Admitting: Cardiovascular Disease

## 2016-04-12 ENCOUNTER — Encounter: Payer: Self-pay | Admitting: Cardiovascular Disease

## 2016-04-12 ENCOUNTER — Encounter: Payer: Self-pay | Admitting: *Deleted

## 2016-04-12 VITALS — BP 160/88 | HR 60 | Ht 70.0 in | Wt 170.0 lb

## 2016-04-12 DIAGNOSIS — I10 Essential (primary) hypertension: Secondary | ICD-10-CM | POA: Diagnosis not present

## 2016-04-12 DIAGNOSIS — R079 Chest pain, unspecified: Secondary | ICD-10-CM

## 2016-04-12 DIAGNOSIS — Z720 Tobacco use: Secondary | ICD-10-CM | POA: Diagnosis not present

## 2016-04-12 DIAGNOSIS — Z8249 Family history of ischemic heart disease and other diseases of the circulatory system: Secondary | ICD-10-CM | POA: Diagnosis not present

## 2016-04-12 NOTE — Patient Instructions (Signed)
Medication Instructions:  Continue all current medications.  Labwork: none  Testing/Procedures: Your physician has requested that you have en exercise stress myoview. For further information please visit [email or web address]. Please follow instruction sheet, as given.  Office will contact with results via phone or letter.    Follow-Up: 2 months   Any Other Special Instructions Will Be Listed Below (If Applicable).  If you need a refill on your cardiac medications before your next appointment, please call your pharmacy.  

## 2016-04-12 NOTE — Addendum Note (Signed)
Addended by: Laurine Blazer on: 04/12/2016 11:01 AM   Modules accepted: Orders

## 2016-04-12 NOTE — Progress Notes (Signed)
CARDIOLOGY CONSULT NOTE  Patient ID: [name] [name] MRN: [id number] DOB/AGE: 1965/10/17 50 y.o.  Admit date: (Not on file) Primary Physician: Worthy Rancher, MD Referring Physician: Wendi Snipes MD  Reason for Consultation: chest pain  HPI: 50 year old male with hypertension referred for the evaluation of chest pain. Denies palpitations, diaphoresis, shortness of breath. It is located on the left lateral aspect of his chest, into the axilla, and radiates down the left arm, primarily in the last 2 months. It is provoked by anxiety and stress. Denies exertional chest pain and dyspnea.  Chest x-ray 03/08/16 showed no edema or consolidation.  Recent ECG showed sinus rhythm with LVH.  Has smoked three quarters of a pack of cigarettes daily for 30 years.  Has history of panic attacks controlled with Zoloft.  Fam: Brother died of MI at 77. Several uncles died of MI's.  No Known Allergies  Current Outpatient Prescriptions  Medication Sig Dispense Refill  . amLODipine-benazepril (LOTREL) 5-10 MG capsule Take 1 capsule by mouth every morning. 90 capsule 3  . sertraline (ZOLOFT) 100 MG tablet Take 1.5 tablets (150 mg total) by mouth daily. 125 tablet 3  . tamsulosin (FLOMAX) 0.4 MG CAPS capsule TAKE 1 CAPSULE (0.4 MG TOTAL) BY MOUTH EVERY MORNING. 90 capsule 3   No current facility-administered medications for this visit.     Past Medical History:  Diagnosis Date  . Anxiety   . Bulging discs   . Chronic kidney disease    kidney stones  . Depression   . Hypertension   . Kidney stones     Past Surgical History:  Procedure Laterality Date  . CYSTOSCOPY W/ URETERAL STENT PLACEMENT  09-2011  . CYSTOSCOPY W/ URETERAL STENT REMOVAL  10-2011  . CYSTOSCOPY WITH RETROGRADE PYELOGRAM, URETEROSCOPY AND STENT PLACEMENT Bilateral 03/26/2014   Procedure: CYSTOSCOPY WITH RETROGRADE PYELOGRAM, URETEROSCOPY AND STENT PLACEMENT;  Surgeon: Bernestine Amass, MD;  Location: [facility];  Service:  Urology;  Laterality: Bilateral;  . HEMORRHOID SURGERY N/A 09/03/2013   Procedure: HEMORRHOIDECTOMY;  Surgeon: Leighton Ruff, MD;  Location: [facility];  Service: General;  Laterality: N/A;  . HOLMIUM LASER APPLICATION Bilateral [id number]   Procedure: HOLMIUM LASER APPLICATION;  Surgeon: Bernestine Amass, MD;  Location: [facility];  Service: Urology;  Laterality: Bilateral;  . LITHOTRIPSY    . STONE EXTRACTION WITH BASKET Left 03/26/2014   Procedure: STONE EXTRACTION WITH BASKET;  Surgeon: Bernestine Amass, MD;  Location: [facility];  Service: Urology;  Laterality: Left;    Social History   Social History  . Marital status: Married    Spouse name: N/A  . Number of children: N/A  . Years of education: N/A   Occupational History  . Not on file.   Social History Main Topics  . Smoking status: Current Every Day Smoker    Packs/day: 0.50    Years: 20.00    Types: Cigarettes    Start date: 12/31/1992  . Smokeless tobacco: Never Used  . Alcohol use Yes     Comment: occasional-1-2 drinks every 2 weeks  . Drug use: No  . Sexual activity: Not on file   Other Topics Concern  . Not on file   Social History Narrative  . No narrative on file       Prior to Admission medications   Medication Sig Start Date End Date Taking? Authorizing Provider  amLODipine-benazepril (LOTREL) 5-10 MG capsule Take 1 capsule by mouth every morning. 03/08/16   Mikeal Hawthorne  Carmin Muskrat, MD  sertraline (ZOLOFT) 100 MG tablet Take 1.5 tablets (150 mg total) by mouth daily. 03/08/16   Timmothy Euler, MD  tamsulosin (FLOMAX) 0.4 MG CAPS capsule TAKE 1 CAPSULE (0.4 MG TOTAL) BY MOUTH EVERY MORNING. 03/08/16   Timmothy Euler, MD     Review of systems complete and found to be negative unless listed above in HPI     Physical exam Blood pressure (!) 160/88, pulse 60, height 5\' 10"  (1.778 m), weight 170 lb (77.1 kg), SpO2 98 %. General: NAD Neck: No JVD, no thyromegaly or thyroid nodule.  Lungs: Clear to auscultation  bilaterally with normal respiratory effort. CV: Nondisplaced PMI. Regular rate and rhythm, normal S1/S2, no S3/S4, no murmur.  No peripheral edema.  No carotid bruit.   Abdomen: Soft, nontender, no hepatosplenomegaly, no distention.  Skin: Intact without lesions or rashes.  Neurologic: Alert and oriented x 3.  Psych: Anxious affect. Extremities: No clubbing or cyanosis.  HEENT: Normal.   ECG: Most recent ECG reviewed.  Labs:   Lab Results  Component Value Date   WBC 7.7 03/08/2016   HGB 14.4 03/26/2014   HCT 47.3 03/08/2016   MCV 88 03/08/2016   PLT 253 03/08/2016   No results for input(s): NA, K, CL, CO2, BUN, CREATININE, CALCIUM, PROT, BILITOT, ALKPHOS, ALT, AST, GLUCOSE in the last 168 hours.  Invalid input(s): LABALBU No results found for: CKTOTAL, CKMB, CKMBINDEX, TROPONINI  Lab Results  Component Value Date   CHOL 171 03/08/2016   CHOL 117 12/31/2012   Lab Results  Component Value Date   HDL 42 03/08/2016   HDL 28 (L) 12/31/2012   Lab Results  Component Value Date   LDLCALC 104 (H) 03/08/2016   LDLCALC 56 12/31/2012   Lab Results  Component Value Date   TRIG 126 03/08/2016   TRIG 163 (H) 12/31/2012   Lab Results  Component Value Date   CHOLHDL 4.1 03/08/2016   CHOLHDL 4.2 12/31/2012   No results found for: LDLDIRECT       Studies: No results found.  ASSESSMENT AND PLAN:  1. Chest pain: Atypical symptomatology given no exertional symptoms but does have several CV risk factors. I will proceed with a nuclear myocardial perfusion imaging study (exercise Myoview) to evaluate for ischemic heart disease.  2. HTN: Elevated. Says he is anxious. Will monitor.  Dispo: fu 2 months.    Signed: Kate Sable, M.D., F.A.C.C.  04/12/2016, 10:42 AM

## 2016-04-23 ENCOUNTER — Encounter (HOSPITAL_COMMUNITY): Payer: No Typology Code available for payment source

## 2016-04-23 ENCOUNTER — Inpatient Hospital Stay (HOSPITAL_COMMUNITY): Admission: RE | Admit: 2016-04-23 | Payer: No Typology Code available for payment source | Source: Ambulatory Visit

## 2016-06-12 ENCOUNTER — Ambulatory Visit: Payer: No Typology Code available for payment source | Admitting: Cardiovascular Disease

## 2016-07-10 ENCOUNTER — Telehealth: Payer: Self-pay | Admitting: *Deleted

## 2016-07-10 NOTE — Telephone Encounter (Signed)
Patient has not had stress test yet.  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  States he will call back the end of January to get test rescheduled.

## 2017-02-22 ENCOUNTER — Ambulatory Visit (INDEPENDENT_AMBULATORY_CARE_PROVIDER_SITE_OTHER): Payer: 59 | Admitting: Family Medicine

## 2017-02-22 ENCOUNTER — Encounter: Payer: Self-pay | Admitting: Family Medicine

## 2017-02-22 VITALS — BP 134/89 | HR 70 | Temp 99.4°F | Ht 70.0 in | Wt 177.4 lb

## 2017-02-22 DIAGNOSIS — M7551 Bursitis of right shoulder: Secondary | ICD-10-CM | POA: Diagnosis not present

## 2017-02-22 DIAGNOSIS — M7021 Olecranon bursitis, right elbow: Secondary | ICD-10-CM | POA: Diagnosis not present

## 2017-02-22 MED ORDER — METHYLPREDNISOLONE ACETATE 80 MG/ML IJ SUSP
80.0000 mg | Freq: Once | INTRAMUSCULAR | Status: AC
  Administered 2017-02-22: 80 mg via INTRAMUSCULAR

## 2017-02-22 MED ORDER — METHYLPREDNISOLONE ACETATE 80 MG/ML IJ SUSP
40.0000 mg | Freq: Once | INTRAMUSCULAR | Status: AC
Start: 2017-02-22 — End: 2017-02-22
  Administered 2017-02-22: 40 mg via INTRAMUSCULAR

## 2017-02-22 NOTE — Progress Notes (Signed)
BP 134/89   Pulse 70   Temp 99.4 F (37.4 C) (Oral)   Ht 5\' 10"  (1.778 m)   Wt 177 lb 6 oz (80.5 kg)   BMI 25.45 kg/m    Subjective:    Patient ID: [name] [name], male    DOB: 1966-03-29, 51 y.o.   MRN: [id number]  HPI: [name] [name] is a 51 y.o. male presenting on 02/22/2017 for Right shoulder pain (ongoing for over a year) and Fluid on right elbow   HPI Right elbow swelling and pain Patient comes in with right elbow swelling and pain is been going on for the past few days. He says is really swollen up but is not painful and is not hot red or warm and has no drainage out of it. He denies any fevers or chills or pain with range of motion in the elbow itself.  Right shoulder pain Patient comes in complaining of right shoulder pain that's been going on for at least 3 years. He says it's worse over the past few months. The shoulder pain is worse with overhead range of motion. He cannot recall any specific incident that caused the trauma. He has been using Tylenol and ibuprofen which have not been helping as much. He did go a few months ago to an urgent care and got a course of prednisone which did help significantly while he was on it but then returned right after. He denies any loss of range of motion but does have some catching with overhead range of motion  Relevant past medical, surgical, family and social history reviewed and updated as indicated. Interim medical history since our last visit reviewed. Allergies and medications reviewed and updated.  Review of Systems  Constitutional: Negative for chills and fever.  Eyes: Negative for discharge.  Respiratory: Negative for shortness of breath and wheezing.   Cardiovascular: Negative for chest pain and leg swelling.  Musculoskeletal: Positive for arthralgias and joint swelling. Negative for back pain and gait problem.  Skin: Negative for color change and rash.  All other systems reviewed and are negative.  Per HPI unless  specifically indicated above     Objective:    BP 134/89   Pulse 70   Temp 99.4 F (37.4 C) (Oral)   Ht 5\' 10"  (1.778 m)   Wt 177 lb 6 oz (80.5 kg)   BMI 25.45 kg/m   Wt Readings from Last 3 Encounters:  02/22/17 177 lb 6 oz (80.5 kg)  04/12/16 170 lb (77.1 kg)  03/08/16 172 lb 12.8 oz (78.4 kg)    Physical Exam  Constitutional: He is oriented to person, place, and time. He appears well-developed and well-nourished. No distress.  Eyes: Conjunctivae are normal. No scleral icterus.  Musculoskeletal: Normal range of motion. He exhibits no edema.       Right shoulder: He exhibits tenderness (Positive Hawkins impingement sign and pain with overhead range of motion. Negative rotator cuff testing). He exhibits normal range of motion, no bony tenderness, no deformity and normal strength.       Right elbow: He exhibits swelling. He exhibits normal range of motion. No tenderness found.  Neurological: He is alert and oriented to person, place, and time. Coordination normal.  Skin: Skin is warm and dry. No rash noted. He is not diaphoretic.  Psychiatric: He has a normal mood and affect. His behavior is normal.  Nursing note and vitals reviewed.   Subacromial, right injection: Consent form signed. Risk factors of bleeding  and infection discussed with patient and patient is agreeable towards injection. Patient prepped with Betadine. Posterior approach towards injection used. Injected 80 mg of Depo-Medrol and 1 mL of 2% lidocaine. Patient tolerated procedure well and no side effects from noted. Minimal to no bleeding. Simple bandage applied after.   Olecranon bursitis right injection and drainage: Consent form signed. Risk factors of bleeding and infection discussed with patient and patient is agreeable towards injection. Patient prepped with Betadine. Lateral approach towards injection used and drainage. Removed 20 mL of straw-colored fluid. Injected 40 mg of Depo-Medrol and 1 mL of 2% lidocaine.  Patient tolerated procedure well and no side effects from noted. Minimal to no bleeding. Simple bandage applied after.     Assessment & Plan:   Problem List Items Addressed This Visit    None    Visit Diagnoses    Subacromial bursitis of right shoulder joint    -  Primary   Relevant Medications   methylPREDNISolone acetate (DEPO-MEDROL) injection 80 mg (Completed)   Olecranon bursitis of right elbow       Relevant Medications   methylPREDNISolone acetate (DEPO-MEDROL) injection 40 mg (Completed)     Patient instructed to call back, shoulder not improved and we can send him to orthopedic if still causing problems in one week.   Follow up plan: Return if symptoms worsen or fail to improve.  Counseling provided for all of the vaccine components No orders of the defined types were placed in this encounter.   Caryl Pina, MD [facility] 02/22/2017, 5:01 PM

## 2017-03-06 ENCOUNTER — Other Ambulatory Visit: Payer: Self-pay

## 2017-03-06 ENCOUNTER — Telehealth: Payer: Self-pay | Admitting: Family Medicine

## 2017-03-06 DIAGNOSIS — M25511 Pain in right shoulder: Secondary | ICD-10-CM

## 2017-03-06 NOTE — Telephone Encounter (Signed)
Patient notified that referral placed to orthopedic for shoulder pain and that we will contact him with an appt. Patient verbalized understanding

## 2017-03-06 NOTE — Telephone Encounter (Signed)
Go ahead and do a referral to orthopedic

## 2017-03-21 ENCOUNTER — Other Ambulatory Visit: Payer: Self-pay | Admitting: Family Medicine

## 2017-03-22 ENCOUNTER — Ambulatory Visit (INDEPENDENT_AMBULATORY_CARE_PROVIDER_SITE_OTHER): Payer: 59 | Admitting: Orthopedic Surgery

## 2017-03-29 ENCOUNTER — Ambulatory Visit (INDEPENDENT_AMBULATORY_CARE_PROVIDER_SITE_OTHER): Payer: 59

## 2017-03-29 ENCOUNTER — Ambulatory Visit (INDEPENDENT_AMBULATORY_CARE_PROVIDER_SITE_OTHER): Payer: 59 | Admitting: Orthopedic Surgery

## 2017-03-29 ENCOUNTER — Encounter (INDEPENDENT_AMBULATORY_CARE_PROVIDER_SITE_OTHER): Payer: Self-pay | Admitting: Orthopedic Surgery

## 2017-03-29 DIAGNOSIS — G8929 Other chronic pain: Secondary | ICD-10-CM | POA: Diagnosis not present

## 2017-03-29 DIAGNOSIS — M25511 Pain in right shoulder: Secondary | ICD-10-CM

## 2017-03-29 NOTE — Progress Notes (Signed)
Office Visit Note   Patient: [name] [name]           Date of Birth: [birth year]           MRN: [id number] Visit Date: 03/29/2017 Requested by: Timmothy Euler, MD [place], [place] [address] PCP: Timmothy Euler, MD  Subjective: Chief Complaint  Patient presents with  . Right Shoulder - Pain    HPI: [name] [name] is a 51 year old patient with right shoulder pain.  Reports that the pain has been severe for the past 2-3 months but it has been ongoing for a year.  He is right-hand-dominant.  He describes decreased range of motion and decreased strength.  He feels like the arm is getting locked at times.  He does a lot of climbing at work.  The pain will wake him from sleep at night.  He has tried aspirin and Advil with no relief.  He does have a history of dislocation 2 but none in 10 years.  He states that he has moments of no strength.  His fingers will occasionally go numb. His main concern at this time is the arm "going out" on him when he is on a ladder or doing something which requires physical strength.              ROS: All systems reviewed are negative as they relate to the chief complaint within the history of present illness.  Patient denies  fevers or chills.   Assessment & Plan: Visit Diagnoses:  1. Chronic right shoulder pain     Plan: impression is right shoulder pain with history of remote dislocation.  He's having symptoms which sound like they've our likely labral in origin.  Either superior labra ltear which is unstable or inferior labral tear giving him symptoms of instability.  His rotator cuff strength is pretty reasonable.  Plan MRI scan to evaluate for #1 SLAP tear #2symptomatic inferior labral pathology #3 rotator cuff tear.  No evidence of frozen shoulder at this time.  Follow-Up Instructions: Return for after MRI.   Orders:  Orders Placed This Encounter  Procedures  . XR Shoulder Right   No orders of the defined types were placed in this  encounter.     Procedures: No procedures performed   Clinical Data: No additional findings.  Objective: Vital Signs: There were no vitals taken for this visit.  Physical Exam:   Constitutional: Patient appears well-developed HEENT:  Head: Normocephalic Eyes:EOM are normal Neck: Normal range of motion Cardiovascular: Normal rate Pulmonary/chest: Effort normal Neurologic: Patient is alert Skin: Skin is warm Psychiatric: Patient has normal mood and affect    Ortho Exam: orthopedic exam demonstrates pretty reasonable forward flexion and abduction in the left and right shoulder.  No acromioclavicular joint tenderness is noted.  Radial pulses intact bilaterally.  Rotator cuff strength is good to infraspinatus supraspinatus and subscapularis muscle testing.  O'Brien's testing equivocal on the right negative on the left.  He does have a little bit of coarseness with labral load testing on the right-hand side.  Negative apprehension and relocation testing with less than a centimeter sulcus sign on the right  Specialty Comments:  No specialty comments available.  Imaging: Xr Shoulder Right  Result Date: 03/29/2017 AP outlet and axillary view right shoulder reviewed.  Chronic Hill-Sachs lesion is noted.  No glenohumeral arthritis is present.  Shoulder is located.  Mild acromioclaviclar degenerative changes presen    PMFS History: Patient Active Problem List  Diagnosis Date Noted  . GAD (generalized anxiety disorder) 03/08/2016  . Internal strangulated hemorrhoids 09/02/2013  . Bleeding internal hemorrhoids 01/12/2013  . BLOOD IN STOOL 03/24/2009   Past Medical History:  Diagnosis Date  . Anxiety   . Bulging discs   . Chronic kidney disease    kidney stones  . Depression   . Hypertension   . Kidney stones     Family History  Problem Relation Age of Onset  . ALS Father     Past Surgical History:  Procedure Laterality Date  . CYSTOSCOPY W/ URETERAL STENT PLACEMENT   09-2011  . CYSTOSCOPY W/ URETERAL STENT REMOVAL  10-2011  . CYSTOSCOPY WITH RETROGRADE PYELOGRAM, URETEROSCOPY AND STENT PLACEMENT Bilateral 03/26/2014   Procedure: CYSTOSCOPY WITH RETROGRADE PYELOGRAM, URETEROSCOPY AND STENT PLACEMENT;  Surgeon: Bernestine Amass, MD;  Location: [facility];  Service: Urology;  Laterality: Bilateral;  . HEMORRHOID SURGERY N/A 09/03/2013   Procedure: HEMORRHOIDECTOMY;  Surgeon: Leighton Ruff, MD;  Location: [facility];  Service: General;  Laterality: N/A;  . HOLMIUM LASER APPLICATION Bilateral 0/93/2671   Procedure: HOLMIUM LASER APPLICATION;  Surgeon: Bernestine Amass, MD;  Location: [facility];  Service: Urology;  Laterality: Bilateral;  . LITHOTRIPSY    . STONE EXTRACTION WITH BASKET Left 03/26/2014   Procedure: STONE EXTRACTION WITH BASKET;  Surgeon: Bernestine Amass, MD;  Location: [facility];  Service: Urology;  Laterality: Left;   Social History   Occupational History  . Not on file.   Social History Main Topics  . Smoking status: Current Every Day Smoker    Packs/day: 0.50    Years: 20.00    Types: Cigarettes    Start date: 12/31/1992  . Smokeless tobacco: Never Used  . Alcohol use Yes     Comment: occasional-1-2 drinks every 2 weeks  . Drug use: No  . Sexual activity: Not on file

## 2017-06-10 ENCOUNTER — Other Ambulatory Visit: Payer: Self-pay | Admitting: Family Medicine

## 2017-10-31 ENCOUNTER — Other Ambulatory Visit: Payer: Self-pay | Admitting: Family Medicine

## 2017-12-02 ENCOUNTER — Other Ambulatory Visit: Payer: Self-pay | Admitting: Family Medicine

## 2017-12-07 ENCOUNTER — Other Ambulatory Visit: Payer: Self-pay | Admitting: Family Medicine

## 2017-12-09 NOTE — Telephone Encounter (Signed)
Last seen 02/22/17  Dr Wendi Snipes

## 2018-01-15 ENCOUNTER — Other Ambulatory Visit: Payer: Self-pay | Admitting: Family Medicine

## 2018-01-17 NOTE — Telephone Encounter (Signed)
Attempted to contact patient and NVM

## 2018-05-02 ENCOUNTER — Other Ambulatory Visit: Payer: Self-pay | Admitting: *Deleted

## 2018-05-02 NOTE — Telephone Encounter (Signed)
Prior [place]. NTBS last OV 02/12/17

## 2018-05-02 NOTE — Telephone Encounter (Signed)
Patient will call back to schedule appt.

## 2018-06-18 ENCOUNTER — Other Ambulatory Visit: Payer: Self-pay | Admitting: *Deleted

## 2018-06-18 NOTE — Telephone Encounter (Signed)
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

## 2018-06-20 NOTE — Telephone Encounter (Signed)
Has upcoming appt °

## 2018-06-27 ENCOUNTER — Encounter: Payer: Self-pay | Admitting: Family Medicine

## 2018-06-27 ENCOUNTER — Ambulatory Visit: Payer: 59 | Admitting: Family Medicine

## 2018-06-27 VITALS — BP 129/86 | HR 68 | Temp 97.8°F | Ht 70.0 in | Wt 185.0 lb

## 2018-06-27 DIAGNOSIS — E663 Overweight: Secondary | ICD-10-CM

## 2018-06-27 DIAGNOSIS — Z0001 Encounter for general adult medical examination with abnormal findings: Secondary | ICD-10-CM

## 2018-06-27 DIAGNOSIS — M25551 Pain in right hip: Secondary | ICD-10-CM

## 2018-06-27 DIAGNOSIS — Z125 Encounter for screening for malignant neoplasm of prostate: Secondary | ICD-10-CM | POA: Diagnosis not present

## 2018-06-27 DIAGNOSIS — I10 Essential (primary) hypertension: Secondary | ICD-10-CM

## 2018-06-27 DIAGNOSIS — Z8249 Family history of ischemic heart disease and other diseases of the circulatory system: Secondary | ICD-10-CM

## 2018-06-27 DIAGNOSIS — Z114 Encounter for screening for human immunodeficiency virus [HIV]: Secondary | ICD-10-CM | POA: Diagnosis not present

## 2018-06-27 DIAGNOSIS — F411 Generalized anxiety disorder: Secondary | ICD-10-CM | POA: Diagnosis not present

## 2018-06-27 DIAGNOSIS — M25552 Pain in left hip: Secondary | ICD-10-CM

## 2018-06-27 DIAGNOSIS — R0789 Other chest pain: Secondary | ICD-10-CM | POA: Diagnosis not present

## 2018-06-27 DIAGNOSIS — N200 Calculus of kidney: Secondary | ICD-10-CM | POA: Diagnosis not present

## 2018-06-27 DIAGNOSIS — Z0000 Encounter for general adult medical examination without abnormal findings: Secondary | ICD-10-CM

## 2018-06-27 DIAGNOSIS — Z130 Encounter for screening for diseases of the blood and blood-forming organs and certain disorders involving the immune mechanism: Secondary | ICD-10-CM

## 2018-06-27 DIAGNOSIS — Z720 Tobacco use: Secondary | ICD-10-CM

## 2018-06-27 MED ORDER — AMLODIPINE BESY-BENAZEPRIL HCL 5-10 MG PO CAPS: 1.0000 | ORAL_CAPSULE | Freq: Every morning | ORAL | 3 refills | Status: DC

## 2018-06-27 MED ORDER — VARENICLINE TARTRATE 0.5 MG X 11 & 1 MG X 42 PO MISC: ORAL | 0 refills | Status: DC

## 2018-06-27 MED ORDER — TAMSULOSIN HCL 0.4 MG PO CAPS: ORAL_CAPSULE | ORAL | 1 refills | Status: DC

## 2018-06-27 MED ORDER — VARENICLINE TARTRATE 1 MG PO TABS: 1.0000 mg | ORAL_TABLET | Freq: Two times a day (BID) | ORAL | 2 refills | Status: DC

## 2018-06-27 MED ORDER — SERTRALINE HCL 100 MG PO TABS: 150.0000 mg | ORAL_TABLET | Freq: Every day | ORAL | 3 refills | Status: DC

## 2018-06-27 NOTE — Patient Instructions (Addendum)
You had labs performed today.  You will be contacted with the results of the labs once they are available, usually in the next 3 business days for routine lab work.   For your joint pain, start Tylenol Arthritis daily.  Coping with Quitting Smoking  Quitting smoking is a physical and mental challenge. You will face cravings, withdrawal symptoms, and temptation. Before quitting, work with your health care provider to make a plan that can help you cope. Preparation can help you quit and keep you from giving in. How can I cope with cravings? Cravings usually last for 5-10 minutes. If you get through it, the craving will pass. Consider taking the following actions to help you cope with cravings:  Keep your mouth busy: ? Chew sugar-free gum. ? Suck on hard candies or a straw. ? Brush your teeth.  Keep your hands and body busy: ? Immediately change to a different activity when you feel a craving. ? Squeeze or play with a ball. ? Do an activity or a hobby, like making bead jewelry, practicing needlepoint, or working with wood. ? Mix up your normal routine. ? Take a short exercise break. Go for a quick walk or run up and down stairs. ? Spend time in public places where smoking is not allowed.  Focus on doing something kind or helpful for someone else.  Call a friend or family member to talk during a craving.  Join a support group.  Call a quit line, such as 1-800-QUIT-NOW.  Talk with your health care provider about medicines that might help you cope with cravings and make quitting easier for you. How can I deal with withdrawal symptoms? Your body may experience negative effects as it tries to get used to not having nicotine in the system. These effects are called withdrawal symptoms. They may include:  Feeling hungrier than normal.  Trouble concentrating.  Irritability.  Trouble sleeping.  Feeling depressed.  Restlessness and agitation.  Craving a cigarette. To manage  withdrawal symptoms:  Avoid places, people, and activities that trigger your cravings.  Remember why you want to quit.  Get plenty of sleep.  Avoid coffee and other caffeinated drinks. These may worsen some of your symptoms. How can I handle social situations? Social situations can be difficult when you are quitting smoking, especially in the first few weeks. To manage this, you can:  Avoid parties, bars, and other social situations where people might be smoking.  Avoid alcohol.  Leave right away if you have the urge to smoke.  Explain to your family and friends that you are quitting smoking. Ask for understanding and support.  Plan activities with friends or family where smoking is not an option. What are some ways I can cope with stress? Wanting to smoke may cause stress, and stress can make you want to smoke. Find ways to manage your stress. Relaxation techniques can help. For example:  Breathe slowly and deeply, in through your nose and out through your mouth.  Listen to soothing, relaxing music.  Talk with a family member or friend about your stress.  Light a candle.  Soak in a bath or take a shower.  Think about a peaceful place. What are some ways I can prevent weight gain? Be aware that many people gain weight after they quit smoking. However, not everyone does. To keep from gaining weight, have a plan in place before you quit and stick to the plan after you quit. Your plan should include:  Having healthy  snacks. When you have a craving, it may help to: ? Eat plain popcorn, crunchy carrots, celery, or other cut vegetables. ? Chew sugar-free gum.  Changing how you eat: ? Eat small portion sizes at meals. ? Eat 4-6 small meals throughout the day instead of 1-2 large meals a day. ? Be mindful when you eat. Do not watch television or do other things that might distract you as you eat.  Exercising regularly: ? Make time to exercise each day. If you do not have time  for a long workout, do short bouts of exercise for 5-10 minutes several times a day. ? Do some form of strengthening exercise, like weight lifting, and some form of aerobic exercise, like running or swimming.  Drinking plenty of water or other low-calorie or no-calorie drinks. Drink 6-8 glasses of water daily, or as much as instructed by your health care provider. Summary  Quitting smoking is a physical and mental challenge. You will face cravings, withdrawal symptoms, and temptation to smoke again. Preparation can help you as you go through these challenges.  You can cope with cravings by keeping your mouth busy (such as by chewing gum), keeping your body and hands busy, and making calls to family, friends, or a helpline for people who want to quit smoking.  You can cope with withdrawal symptoms by avoiding places where people smoke, avoiding drinks with caffeine, and getting plenty of rest.  Ask your health care provider about the different ways to prevent weight gain, avoid stress, and handle social situations. This information is not intended to replace advice given to you by your health care provider. Make sure you discuss any questions you have with your health care provider. Document Released: 06/22/2016 Document Revised: 06/22/2016 Document Reviewed: 06/22/2016 Elsevier Interactive Patient Education  2019 [place] Maintenance, Male A healthy lifestyle and preventive care is important for your health and wellness. Ask your health care provider about what schedule of regular examinations is right for you. What should I know about weight and diet? Eat a Healthy Diet  Eat plenty of vegetables, fruits, whole grains, low-fat dairy products, and lean protein.  Do not eat a lot of foods high in solid fats, added sugars, or salt.  Maintain a Healthy Weight Regular exercise can help you achieve or maintain a healthy weight. You should:  Do at least 150 minutes of exercise  each week. The exercise should increase your heart rate and make you sweat (moderate-intensity exercise).  Do strength-training exercises at least twice a week. Watch Your Levels of Cholesterol and Blood Lipids  Have your blood tested for lipids and cholesterol every 5 years starting at 53 years of age. If you are at high risk for heart disease, you should start having your blood tested when you are 53 years old. You may need to have your cholesterol levels checked more often if: ? Your lipid or cholesterol levels are high. ? You are older than 53 years of age. ? You are at high risk for heart disease. What should I know about cancer screening? Many types of cancers can be detected early and may often be prevented. Lung Cancer  You should be screened every year for lung cancer if: ? You are a current smoker who has smoked for at least 30 years. ? You are a former smoker who has quit within the past 15 years.  Talk to your health care provider about your screening options, when you should start screening, and  how often you should be screened. Colorectal Cancer  Routine colorectal cancer screening usually begins at 53 years of age and should be repeated every 5-10 years until you are 53 years old. You may need to be screened more often if early forms of precancerous polyps or small growths are found. Your health care provider may recommend screening at an earlier age if you have risk factors for colon cancer.  Your health care provider may recommend using home test kits to check for hidden blood in the stool.  A small camera at the end of a tube can be used to examine your colon (sigmoidoscopy or colonoscopy). This checks for the earliest forms of colorectal cancer. Prostate and Testicular Cancer  Depending on your age and overall health, your health care provider may do certain tests to screen for prostate and testicular cancer.  Talk to your health care provider about any symptoms or  concerns you have about testicular or prostate cancer. Skin Cancer  Check your skin from head to toe regularly.  Tell your health care provider about any new moles or changes in moles, especially if: ? There is a change in a mole's size, shape, or color. ? You have a mole that is larger than a pencil eraser.  Always use sunscreen. Apply sunscreen liberally and repeat throughout the day.  Protect yourself by wearing long sleeves, pants, a wide-brimmed hat, and sunglasses when outside. What should I know about heart disease, diabetes, and high blood pressure?  If you are 75-73 years of age, have your blood pressure checked every 3-5 years. If you are 69 years of age or older, have your blood pressure checked every year. You should have your blood pressure measured twice-once when you are at a hospital or clinic, and once when you are not at a hospital or clinic. Record the average of the two measurements. To check your blood pressure when you are not at a hospital or clinic, you can use: ? An automated blood pressure machine at a pharmacy. ? A home blood pressure monitor.  Talk to your health care provider about your target blood pressure.  If you are between 63-78 years old, ask your health care provider if you should take aspirin to prevent heart disease.  Have regular diabetes screenings by checking your fasting blood sugar level. ? If you are at a normal weight and have a low risk for diabetes, have this test once every three years after the age of 2. ? If you are overweight and have a high risk for diabetes, consider being tested at a younger age or more often.  A one-time screening for abdominal aortic aneurysm (AAA) by ultrasound is recommended for men aged 61-75 years who are current or former smokers. What should I know about preventing infection? Hepatitis B If you have a higher risk for hepatitis B, you should be screened for this virus. Talk with your health care provider to  find out if you are at risk for hepatitis B infection. Hepatitis C Blood testing is recommended for:  Everyone born from [birth year] through 1966.  Anyone with known risk factors for hepatitis C. Sexually Transmitted Diseases (STDs)  You should be screened each year for STDs including gonorrhea and chlamydia if: ? You are sexually active and are younger than 53 years of age. ? You are older than 53 years of age and your health care provider tells you that you are at risk for this type of infection. ? Your sexual  activity has changed since you were last screened and you are at an increased risk for chlamydia or gonorrhea. Ask your health care provider if you are at risk.  Talk with your health care provider about whether you are at high risk of being infected with HIV. Your health care provider may recommend a prescription medicine to help prevent HIV infection. What else can I do?  Schedule regular health, dental, and eye exams.  Stay current with your vaccines (immunizations).  Do not use any tobacco products, such as cigarettes, chewing tobacco, and e-cigarettes. If you need help quitting, ask your health care provider.  Limit alcohol intake to no more than 2 drinks per day. One drink equals 12 ounces of beer, 5 ounces of wine, or 1 ounces of hard liquor.  Do not use street drugs.  Do not share needles.  Ask your health care provider for help if you need support or information about quitting drugs.  Tell your health care provider if you often feel depressed.  Tell your health care provider if you have ever been abused or do not feel safe at home. This information is not intended to replace advice given to you by your health care provider. Make sure you discuss any questions you have with your health care provider. Document Released: 12/22/2007 Document Revised: 02/22/2016 Document Reviewed: 03/29/2015 Elsevier Interactive Patient Education  2019 [employer].

## 2018-06-27 NOTE — Progress Notes (Signed)
[name] [name] is a 52 y.o. male presents to office today for annual physical exam examination.    Concerns today include: 1.  Hypertension/ tobacco use disorder Patient reports that he ran out of his medications almost 2 weeks ago.  He has had increased headaches and left-sided chest pain, which is chronic for many years for him.  He notes that it occurs when he is under stressful situations he also have headaches during those times.  No associated nausea, vomiting, shortness of breath.  Pain is left-sided.  It does not radiate.  He has not seen a cardiologist but is interested in seeing one.  He was supposed to have a stress test a couple of years ago for this atypical pain but notes that his insurance had lapsed and therefore it was never done.  He reports to me that his mother died 2 days prior to his birthday this year from congestive heart failure.  He has a brother who died of an MI at age 62.  Patient is an active every day smoker and has smoked about 1/2 pack to 1.5 packs/day for 21 years now.  Denies any hemoptysis, unplanned weight loss, night sweats or pleuritic chest pain.  No shortness of breath.  He is interested in starting Chantix for smoking cessation, as his wife is having success with this currently.  2.  Nephrolithiasis Patient reports longstanding history of renal stones.  He has been on chronic Flomax for this and states that it does seem to help.  He has not needed urologic intervention in almost 2 years now.  Denies any hematuria, dysuria, CVA pain, fevers.  He has been out of the medication for about 2 weeks and notes that he has had some difficulty with stream and urinary frequency since being off of the medicine.  Last coitus/ejaculation was greater than 24 hours ago.  3.  Anxiety disorder Patient reports longstanding history of anxiety disorder.  He reports compliance with Zoloft 150 mg daily and states it works well to control anxiety symptoms.  He notes that anxiety was  quite a bit increased surrounding the death of his mother, whom he was very close to.  Symptoms seem to be a bit better now.  He has his wife and son at home, who he can rely on and has good relationships with.  No history of mental health hospitalizations.  Occupation: Media planner, Marital status: Married, Substance use: Tobacco use as above Diet: Rich in fruits, low in green leafy vegetables because of kidney stones, Exercise: Played ball well in his 40s but low back pain and hip pains tend to impede his ability to be as active as he like.  He is using aspirin for this but it is not especially helpful. Last eye exam: Does not see eye doctor Last dental exam: Does not see dentist Last colonoscopy: 2010 Refills needed today: All meds Immunizations needed: States he had TDap during hospitalization for hemorrhoid surgery.  Past Medical History:  Diagnosis Date  . Anxiety   . Bulging discs   . Chronic kidney disease    kidney stones  . Depression   . Hypertension   . Kidney stones    Social History   Socioeconomic History  . Marital status: Married    Spouse name: Not on file  . Number of children: 1  . Years of education: Not on file  . Highest education level: Not on file  Occupational History  . Occupation: Office manager  Social Needs  .  Financial resource strain: Not on file  . Food insecurity:    Worry: Not on file    Inability: Not on file  . Transportation needs:    Medical: Not on file    Non-medical: Not on file  Tobacco Use  . Smoking status: Current Every Day Smoker    Packs/day: 1.00    Years: 21.00    Pack years: 21.00    Types: Cigarettes    Start date: 12/31/1992  . Smokeless tobacco: Never Used  Substance and Sexual Activity  . Alcohol use: Yes    Comment: occasional-1-2 drinks every 2 weeks  . Drug use: No  . Sexual activity: Not Currently  Lifestyle  . Physical activity:    Days per week: Not on file    Minutes per session: Not  on file  . Stress: Not on file  Relationships  . Social connections:    Talks on phone: Not on file    Gets together: Not on file    Attends religious service: Not on file    Active member of club or organization: Not on file    Attends meetings of clubs or organizations: Not on file    Relationship status: Not on file  . Intimate partner violence:    Fear of current or ex partner: Not on file    Emotionally abused: Not on file    Physically abused: Not on file    Forced sexual activity: Not on file  Other Topics Concern  . Not on file  Social History Narrative  . Not on file   Past Surgical History:  Procedure Laterality Date  . CYSTOSCOPY W/ URETERAL STENT PLACEMENT  09-2011  . CYSTOSCOPY W/ URETERAL STENT REMOVAL  10-2011  . CYSTOSCOPY WITH RETROGRADE PYELOGRAM, URETEROSCOPY AND STENT PLACEMENT Bilateral 03/26/2014   Procedure: CYSTOSCOPY WITH RETROGRADE PYELOGRAM, URETEROSCOPY AND STENT PLACEMENT;  Surgeon: Bernestine Amass, MD;  Location: [facility];  Service: Urology;  Laterality: Bilateral;  . HEMORRHOID SURGERY N/A 09/03/2013   Procedure: HEMORRHOIDECTOMY;  Surgeon: Leighton Ruff, MD;  Location: [facility];  Service: General;  Laterality: N/A;  . HOLMIUM LASER APPLICATION Bilateral 5/36/6440   Procedure: HOLMIUM LASER APPLICATION;  Surgeon: Bernestine Amass, MD;  Location: [facility];  Service: Urology;  Laterality: Bilateral;  . LITHOTRIPSY    . STONE EXTRACTION WITH BASKET Left 03/26/2014   Procedure: STONE EXTRACTION WITH BASKET;  Surgeon: Bernestine Amass, MD;  Location: [facility];  Service: Urology;  Laterality: Left;   Family History  Problem Relation Age of Onset  . Congestive Heart Failure Mother   . ALS Father   . Heart attack Brother 36  . Healthy Son     Current Outpatient Medications:  .  amLODipine-benazepril (LOTREL) 5-10 MG capsule, TAKE 1 CAPSULE BY MOUTH EVERY MORNING., Disp: 90 capsule, Rfl: 1 .  sertraline (ZOLOFT) 100 MG tablet, TAKE 1.5 TABLETS (150 MG TOTAL) BY MOUTH  DAILY., Disp: 135 tablet, Rfl: 1 .  tamsulosin (FLOMAX) 0.4 MG CAPS capsule, TAKE 1 CAPSULE (0.4 MG TOTAL) BY MOUTH EVERY MORNING., Disp: 30 capsule, Rfl: 1  No Known Allergies   ROS: Review of Systems Constitutional: negative Eyes: negative Ears, nose, mouth, throat, and face: negative Respiratory: negative Cardiovascular: positive for chest pain and as above Gastrointestinal: negative Genitourinary:positive for nocturia and renal stone as above Integument/breast: negative Hematologic/lymphatic: negative Musculoskeletal:positive for arthralgias and back pain Neurological: negative Behavioral/Psych: positive for anxiety Endocrine: negative Allergic/Immunologic: negative    Physical exam BP 129/86  Pulse 68   Temp 97.8 F (36.6 C) (Oral)   Ht '5\' 10"'  (1.778 m)   Wt 185 lb (83.9 kg)   BMI 26.54 kg/m  General appearance: alert, cooperative, appears stated age and no distress Head: Normocephalic, without obvious abnormality, atraumatic Eyes: negative findings: lids and lashes normal, conjunctivae and sclerae normal, corneas clear and pupils equal, round, reactive to light and accomodation Ears: normal TM's and external ear canals both ears Nose: Nares normal. Septum midline. Mucosa normal. No drainage or sinus tenderness. Throat: nicotine staining of teeth. no oral masses appreciated. Neck: no adenopathy, supple, symmetrical, trachea midline and thyroid not enlarged, symmetric, no tenderness/mass/nodules Back: symmetric, no curvature. ROM normal. No CVA tenderness. Lungs: clear to auscultation bilaterally Chest wall: no tenderness Heart: regular rate and rhythm, S1, S2 normal, no murmur, click, rub or gallop Abdomen: soft, non-tender; bowel sounds normal; no masses,  no organomegaly Extremities: extremities normal, atraumatic, no cyanosis or edema Pulses: 2+ and symmetric Skin: Skin color, texture, turgor normal. No rashes or lesions Lymph nodes: Cervical, supraclavicular,  and axillary nodes normal. Neurologic: Alert and oriented X 3, normal strength and tone. Normal symmetric reflexes. Normal coordination and gait Psych: Mood stable, speech normal, affect appropriate, pleasant. Depression screen [facility] 2/9 06/27/2018 02/22/2017 03/08/2016  Decreased Interest 1 0 0  Down, Depressed, Hopeless 0 0 0  PHQ - 2 Score 1 0 0  Altered sleeping 0 - -  Tired, decreased energy 0 - -  Change in appetite 0 - -  Feeling bad or failure about yourself  0 - -  Trouble concentrating 0 - -  Moving slowly or fidgety/restless 0 - -  Suicidal thoughts 0 - -  PHQ-9 Score 1 - -  Difficult doing work/chores Not difficult at all - -   GAD 7 : Generalized Anxiety Score 06/27/2018  Nervous, Anxious, on Edge 1  Control/stop worrying 1  Worry too much - different things 2  Trouble relaxing 1  Restless 2  Easily annoyed or irritable 1  Afraid - awful might happen 0  Total GAD 7 Score 8  Anxiety Difficulty Somewhat difficult     Assessment/ Plan: Malachy Moan here for annual physical exam.   1. Annual physical exam I have brought his attention to the fact that he will be needing a repeat colonoscopy next year.  He will make sure to get this scheduled.  We will work on trying to locate the tetanus that he thinks he had administered during previous hospitalization.  2. Essential hypertension Under good control today.  I have refilled his amlodipine/benazepril.  Check lipid, which is nonfasting today, and metabolic panel. - Lipid Panel - CMP14+EGFR - amLODipine-benazepril (LOTREL) 5-10 MG capsule; Take 1 capsule by mouth every morning.  Dispense: 90 capsule; Refill: 3  3. GAD (generalized anxiety disorder) I think that he is having some acute stress related to the passing of his mother and holidays.  Continue Zoloft at current dose.  He will follow-up if needed. - TSH - sertraline (ZOLOFT) 100 MG tablet; Take 1.5 tablets (150 mg total) by mouth daily.  Dispense: 135 tablet;  Refill: 3  4. Nephrolithiasis Refilled Flomax.  Check PSA as below. - tamsulosin (FLOMAX) 0.4 MG CAPS capsule; TAKE 1 CAPSULE (0.4 MG TOTAL) BY MOUTH EVERY MORNING.  Dispense: 90 capsule; Refill: 1  5. Pain of both hip joints I recommended scheduling Tylenol arthritis.  He will follow-up with me on an as-needed basis for this.  6. Atypical chest pain  Has been going on for many years.  I reviewed his EKG from August 2017 which did not demonstrate any ischemic findings.  I have placed a referral to cardiology for stress testing.  We will also check lipid panel today. - Ambulatory referral to Cardiology  7. Family history of MI (myocardial infarction) - Ambulatory referral to Cardiology  8. Tobacco use Ready to stop smoking.  I reviewed the possible side effects of Chantix.  He was given the starter pack prescription and continuing pack prescriptions today and also given a coupon. - varenicline (CHANTIX STARTING MONTH PAK) 0.5 MG X 11 & 1 MG X 42 tablet; Take 0.5 mg tablet by mouth once daily x3 days, then 0.5 mg tablet twice daily x4 days, then increase to one 1 mg tablet twice daily.  Dispense: 53 tablet; Refill: 0 - varenicline (CHANTIX CONTINUING MONTH PAK) 1 MG tablet; Take 1 tablet (1 mg total) by mouth 2 (two) times daily.  Dispense: 60 tablet; Refill: 2  9. Overweight (BMI 25.0-29.9) - Lipid Panel - CMP14+EGFR - TSH  10. Screening, anemia, deficiency, iron - CBC  11. Screening for malignant neoplasm of prostate Last intercourse greater than 24 hours ago.  Currently having nocturia. - PSA  12. Screening for HIV without presence of risk factors - HIV antibody (with reflex)   Counseled on healthy lifestyle choices, including diet (rich in fruits, vegetables and lean meats and low in salt and simple carbohydrates) and exercise (at least 30 minutes of moderate physical activity daily).  Patient to follow up in 1 year for annual exam or sooner if needed.   M. Lajuana Ripple,  DO

## 2018-06-28 LAB — CBC
HEMATOCRIT: 45.5 % (ref 37.5–51.0)
Hemoglobin: 15.1 g/dL (ref 13.0–17.7)
MCH: 29 pg (ref 26.6–33.0)
MCHC: 33.2 g/dL (ref 31.5–35.7)
MCV: 88 fL (ref 79–97)
Platelets: 233 10*3/uL (ref 150–450)
RBC: 5.2 x10E6/uL (ref 4.14–5.80)
RDW: 12.9 % (ref 12.3–15.4)
WBC: 7.4 10*3/uL (ref 3.4–10.8)

## 2018-06-28 LAB — CMP14+EGFR
ALBUMIN: 4 g/dL (ref 3.5–5.5)
ALT: 17 IU/L (ref 0–44)
AST: 21 IU/L (ref 0–40)
Albumin/Globulin Ratio: 1.8 (ref 1.2–2.2)
Alkaline Phosphatase: 97 IU/L (ref 39–117)
BUN / CREAT RATIO: 12 (ref 9–20)
BUN: 10 mg/dL (ref 6–24)
Bilirubin Total: 0.5 mg/dL (ref 0.0–1.2)
CALCIUM: 9.1 mg/dL (ref 8.7–10.2)
CO2: 22 mmol/L (ref 20–29)
CREATININE: 0.85 mg/dL (ref 0.76–1.27)
Chloride: 103 mmol/L (ref 96–106)
GFR, EST AFRICAN AMERICAN: 115 mL/min/{1.73_m2} (ref >59)
GFR, EST NON AFRICAN AMERICAN: 99 mL/min/{1.73_m2} (ref >59)
GLOBULIN, TOTAL: 2.2 g/dL (ref 1.5–4.5)
Glucose: 101 mg/dL — ABNORMAL HIGH (ref 65–99)
Potassium: 4.1 mmol/L (ref 3.5–5.2)
SODIUM: 141 mmol/L (ref 134–144)
Total Protein: 6.2 g/dL (ref 6.0–8.5)

## 2018-06-28 LAB — LIPID PANEL
CHOLESTEROL TOTAL: 156 mg/dL (ref 100–199)
Chol/HDL Ratio: 3.8 ratio (ref 0.0–5.0)
HDL: 41 mg/dL (ref >39)
LDL Calculated: 93 mg/dL (ref 0–99)
TRIGLYCERIDES: 110 mg/dL (ref 0–149)
VLDL Cholesterol Cal: 22 mg/dL (ref 5–40)

## 2018-06-28 LAB — PSA: PROSTATE SPECIFIC AG, SERUM: 2.1 ng/mL (ref 0.0–4.0)

## 2018-06-28 LAB — HIV ANTIBODY (ROUTINE TESTING W REFLEX): HIV Screen 4th Generation wRfx: NONREACTIVE

## 2018-06-28 LAB — TSH: TSH: 0.926 u[IU]/mL (ref 0.450–4.500)

## 2018-08-22 ENCOUNTER — Ambulatory Visit: Payer: 59 | Admitting: Family Medicine

## 2018-09-05 ENCOUNTER — Ambulatory Visit: Payer: BLUE CROSS/BLUE SHIELD | Admitting: Cardiovascular Disease

## 2018-09-05 ENCOUNTER — Encounter: Payer: Self-pay | Admitting: Cardiovascular Disease

## 2018-09-05 VITALS — BP 136/82 | HR 66 | Ht 71.0 in | Wt 182.0 lb

## 2018-09-05 DIAGNOSIS — I10 Essential (primary) hypertension: Secondary | ICD-10-CM

## 2018-09-05 DIAGNOSIS — R079 Chest pain, unspecified: Secondary | ICD-10-CM

## 2018-09-05 DIAGNOSIS — Z720 Tobacco use: Secondary | ICD-10-CM

## 2018-09-05 NOTE — Progress Notes (Signed)
SUBJECTIVE: The patient presents for the evaluation of chest pain.  I evaluated him once before in October 2017 for chest pain.  I ordered a stress test but this was never pursued.  He presents today as he is referred by his PCP.  Past medical history includes hypertension and tobacco abuse.  He is planning to quit and Chantix was recently prescribed.  ECG performed in the office today which I ordered and personally interpreted demonstrates normal sinus rhythm with early repolarization changes.  He has been experiencing chest pain intermittently for years.  It can occur both at rest and with exertion.  He denies associated shortness of breath.  It has awoken him from sleep.  He describes it as sharp and located in the left inframammary and left infra axillary region with occasional radiation down the left arm into his wrist.  He denies leg swelling, palpitations, orthopnea, paroxysmal nocturnal dyspnea.  He had been smoking between a pack and a pack and a half of cigarettes daily since his early 80s.  Family history: Mother had CHF.  Brother had an MI at age 58.   Review of Systems: As per "subjective", otherwise negative.  No Known Allergies  Current Outpatient Medications  Medication Sig Dispense Refill  . amLODipine-benazepril (LOTREL) 5-10 MG capsule Take 1 capsule by mouth every morning. 90 capsule 3  . sertraline (ZOLOFT) 100 MG tablet Take 1.5 tablets (150 mg total) by mouth daily. 135 tablet 3  . tamsulosin (FLOMAX) 0.4 MG CAPS capsule TAKE 1 CAPSULE (0.4 MG TOTAL) BY MOUTH EVERY MORNING. 90 capsule 1  . varenicline (CHANTIX CONTINUING MONTH PAK) 1 MG tablet Take 1 tablet (1 mg total) by mouth 2 (two) times daily. 60 tablet 2  . varenicline (CHANTIX STARTING MONTH PAK) 0.5 MG X 11 & 1 MG X 42 tablet Take 0.5 mg tablet by mouth once daily x3 days, then 0.5 mg tablet twice daily x4 days, then increase to one 1 mg tablet twice daily. 53 tablet 0   No current  facility-administered medications for this visit.     Past Medical History:  Diagnosis Date  . Anxiety   . Bulging discs   . Chronic kidney disease    kidney stones  . Depression   . Hypertension   . Kidney stones     Past Surgical History:  Procedure Laterality Date  . CYSTOSCOPY W/ URETERAL STENT PLACEMENT  09-2011  . CYSTOSCOPY W/ URETERAL STENT REMOVAL  10-2011  . CYSTOSCOPY WITH RETROGRADE PYELOGRAM, URETEROSCOPY AND STENT PLACEMENT Bilateral 03/26/2014   Procedure: CYSTOSCOPY WITH RETROGRADE PYELOGRAM, URETEROSCOPY AND STENT PLACEMENT;  Surgeon: Bernestine Amass, MD;  Location: [facility];  Service: Urology;  Laterality: Bilateral;  . HEMORRHOID SURGERY N/A 09/03/2013   Procedure: HEMORRHOIDECTOMY;  Surgeon: Leighton Ruff, MD;  Location: [facility];  Service: General;  Laterality: N/A;  . HOLMIUM LASER APPLICATION Bilateral 1/61/0960   Procedure: HOLMIUM LASER APPLICATION;  Surgeon: Bernestine Amass, MD;  Location: [facility];  Service: Urology;  Laterality: Bilateral;  . LITHOTRIPSY    . STONE EXTRACTION WITH BASKET Left 03/26/2014   Procedure: STONE EXTRACTION WITH BASKET;  Surgeon: Bernestine Amass, MD;  Location: [facility];  Service: Urology;  Laterality: Left;    Social History   Socioeconomic History  . Marital status: Married    Spouse name: Not on file  . Number of children: 1  . Years of education: Not on file  . Highest education level: Not on file  Occupational History  . Occupation: Office manager  Social Needs  . Financial resource strain: Not on file  . Food insecurity:    Worry: Not on file    Inability: Not on file  . Transportation needs:    Medical: Not on file    Non-medical: Not on file  Tobacco Use  . Smoking status: Current Every Day Smoker    Packs/day: 1.00    Years: 21.00    Pack years: 21.00    Types: Cigarettes    Start date: 12/31/1992  . Smokeless tobacco: Never Used  Substance and Sexual Activity  . Alcohol use: Yes    Comment: occasional-1-2  drinks every 2 weeks  . Drug use: No  . Sexual activity: Not Currently  Lifestyle  . Physical activity:    Days per week: Not on file    Minutes per session: Not on file  . Stress: Not on file  Relationships  . Social connections:    Talks on phone: Not on file    Gets together: Not on file    Attends religious service: Not on file    Active member of club or organization: Not on file    Attends meetings of clubs or organizations: Not on file    Relationship status: Not on file  . Intimate partner violence:    Fear of current or ex partner: Not on file    Emotionally abused: Not on file    Physically abused: Not on file    Forced sexual activity: Not on file  Other Topics Concern  . Not on file  Social History Narrative  . Not on file     Vitals:   09/05/18 0817 09/05/18 0819  BP: 122/78 136/82  Pulse: 66   Weight: 182 lb (82.6 kg)   Height: 5\' 11"  (1.803 m)     Wt Readings from Last 3 Encounters:  09/05/18 182 lb (82.6 kg)  06/27/18 185 lb (83.9 kg)  02/22/17 177 lb 6 oz (80.5 kg)     PHYSICAL EXAM General: NAD HEENT: Normal. Neck: No JVD, no thyromegaly. Lungs: Bilateral wheezing and rhonchi, no rales. CV: Regular rate and rhythm, normal S1/S2, no S3/S4, no murmur. No pretibial or periankle edema.  No carotid bruit.   Abdomen: Soft, nontender, no distention.  Neurologic: Alert and oriented.  Psych: Normal affect. Skin: Normal. Musculoskeletal: No gross deformities.    ECG: Reviewed above under Subjective   Labs: Lab Results  Component Value Date/Time   K 4.1 06/27/2018 11:57 AM   BUN 10 06/27/2018 11:57 AM   CREATININE 0.85 06/27/2018 11:57 AM   CREATININE 0.83 12/31/2012 02:55 PM   ALT 17 06/27/2018 11:57 AM   TSH 0.926 06/27/2018 11:57 AM   HGB 15.1 06/27/2018 11:57 AM     Lipids: Lab Results  Component Value Date/Time   LDLCALC 93 06/27/2018 11:57 AM   CHOL 156 06/27/2018 11:57 AM   TRIG 110 06/27/2018 11:57 AM   HDL 41 06/27/2018  11:57 AM       ASSESSMENT AND PLAN:  1.  Chest pain: Typical and atypical features.  Cardiovascular risk factors include hypertension, tobacco abuse, and family history of heart disease.  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  I will proceed with a nuclear myocardial perfusion imaging study to evaluate for ischemic heart disease (exercise Myoview).  2.  Hypertension: Controlled on present therapy.  No changes.  3.  Tobacco use: He is using Chantix to quit.  He had been smoking a pack to a pack and a half of cigarettes daily since his early 28s.   Disposition: Follow up 3 months   Kate Sable, M.D., F.A.C.C.

## 2018-09-05 NOTE — Patient Instructions (Signed)
Medication Instructions: Your physician recommends that you continue on your current medications as directed. Please refer to the Current Medication list given to you today.   Labwork: None today  Procedures/Testing: Your physician has requested that you have en exercise stress myoview. For further information please visit [email or web address]. Please follow instruction sheet, as given.   Follow-Up: 3 months with Dr.Koneswaran  Any Additional Special Instructions Will Be Listed Below (If Applicable).     If you need a refill on your cardiac medications before your next appointment, please call your pharmacy.

## 2018-09-12 ENCOUNTER — Encounter (HOSPITAL_COMMUNITY)
Admission: RE | Admit: 2018-09-12 | Discharge: 2018-09-12 | Disposition: A | Discharge status: Home / Self Care | Payer: BLUE CROSS/BLUE SHIELD | Source: Ambulatory Visit | Attending: Cardiovascular Disease | Admitting: Cardiovascular Disease

## 2018-09-12 ENCOUNTER — Ambulatory Visit (HOSPITAL_COMMUNITY)
Admission: RE | Admit: 2018-09-12 | Discharge: 2018-09-12 | Disposition: A | Discharge status: Home / Self Care | Payer: BLUE CROSS/BLUE SHIELD | Source: Ambulatory Visit | Attending: Cardiovascular Disease | Admitting: Cardiovascular Disease

## 2018-09-12 ENCOUNTER — Encounter (HOSPITAL_COMMUNITY): Payer: Self-pay

## 2018-09-12 DIAGNOSIS — R079 Chest pain, unspecified: Secondary | ICD-10-CM | POA: Diagnosis not present

## 2018-09-12 LAB — NM MYOCAR MULTI W/SPECT W/WALL MOTION / EF
CHL RATE OF PERCEIVED EXERTION: 15
Estimated workload: 13.4 METS
Exercise duration (min): 11 min
Exercise duration (sec): 1 s
LV sys vol: 37 mL
LVDIAVOL: 101 mL (ref 62–150)
MPHR: 167 {beats}/min
Peak HR: 144 {beats}/min
Percent HR: 86 %
RATE: 0.41
Rest HR: 60 {beats}/min
SDS: 0
SRS: 1
SSS: 1
TID: 1.09

## 2018-09-12 MED ORDER — SODIUM CHLORIDE 0.9% FLUSH
INTRAVENOUS | Status: AC
  Administered 2018-09-12: 10 mL via INTRAVENOUS
  Filled 2018-09-12: qty 10

## 2018-09-12 MED ORDER — TECHNETIUM TC 99M TETROFOSMIN IV KIT
10.0000 | PACK | Freq: Once | INTRAVENOUS | Status: AC | PRN
  Administered 2018-09-12: 10.3 via INTRAVENOUS

## 2018-09-12 MED ORDER — TECHNETIUM TC 99M TETROFOSMIN IV KIT
30.0000 | PACK | Freq: Once | INTRAVENOUS | Status: AC | PRN
  Administered 2018-09-12: 32.8 via INTRAVENOUS

## 2018-09-12 MED ORDER — REGADENOSON 0.4 MG/5ML IV SOLN
INTRAVENOUS | Status: AC
  Filled 2018-09-12: qty 5

## 2018-10-09 ENCOUNTER — Ambulatory Visit: Payer: BLUE CROSS/BLUE SHIELD | Admitting: Family Medicine

## 2018-10-09 ENCOUNTER — Other Ambulatory Visit: Payer: Self-pay

## 2018-10-09 ENCOUNTER — Encounter: Payer: Self-pay | Admitting: Family Medicine

## 2018-10-09 VITALS — BP 140/91 | HR 76 | Temp 97.8°F | Ht 71.0 in | Wt 184.0 lb

## 2018-10-09 DIAGNOSIS — N200 Calculus of kidney: Secondary | ICD-10-CM

## 2018-10-09 LAB — URINALYSIS, COMPLETE
Bilirubin, UA: NEGATIVE
Glucose, UA: NEGATIVE
Ketones, UA: NEGATIVE
Nitrite, UA: NEGATIVE
Protein,UA: NEGATIVE
Specific Gravity, UA: >1.03 — ABNORMAL HIGH (ref 1.005–1.030)
Urobilinogen, Ur: 1 mg/dL (ref 0.2–1.0)
pH, UA: 6 (ref 5.0–7.5)

## 2018-10-09 LAB — MICROSCOPIC EXAMINATION: Bacteria, UA: NONE SEEN

## 2018-10-09 MED ORDER — HYDROCODONE-ACETAMINOPHEN 5-325 MG PO TABS: 1.0000 | ORAL_TABLET | Freq: Four times a day (QID) | ORAL | 0 refills | Status: DC | PRN

## 2018-10-09 NOTE — Patient Instructions (Signed)
Increase your Flomax to 2 capsules ONCE daily for the next couple of days.  Hopefully, this will help pass your stone.  I have sent in a pain medication.  It is NOT recommended to operate heavy machinery/ drive while on this medication.  I can cause sedation.  We discussed reasons for emergent evaluation in the emergency department.   Kidney Stones  Kidney stones (urolithiasis) are rock-like masses that form inside of the kidneys. Kidneys are organs that make pee (urine). A kidney stone can cause very bad pain and can block the flow of pee. The stone usually leaves your body (passes) through your pee. You may need to have a doctor take out the stone. Follow these instructions at home: Eating and drinking  Drink enough fluid to keep your pee clear or pale yellow. This will help you pass the stone.  If told by your doctor, change the foods you eat (your diet). This may include: ? Limiting how much salt (sodium) you eat. ? Eating more fruits and vegetables. ? Limiting how much meat, poultry, fish, and eggs you eat.  Follow instructions from your doctor about eating or drinking restrictions. General instructions  Collect pee samples as told by your doctor. You may need to collect a pee sample: ? 24 hours after a stone comes out. ? 8-12 weeks after a stone comes out, and every 6-12 months after that.  Strain your pee every time you pee (urinate), for as long as told. Use the strainer that your doctor recommends.  Do not throw out the stone. Keep it so that it can be tested by your doctor.  Take over-the-counter and prescription medicines only as told by your doctor.  Keep all follow-up visits as told by your doctor. This is important. You may need follow-up tests. Preventing kidney stones To prevent another kidney stone:  Drink enough fluid to keep your pee clear or pale yellow. This is the best way to prevent kidney stones.  Eat healthy foods.  Avoid certain foods as told by your  doctor. You may be told to eat less protein.  Stay at a healthy weight. Contact a doctor if:  You have pain that gets worse or does not get better with medicine. Get help right away if:  You have a fever or chills.  You get very bad pain.  You get new pain in your belly (abdomen).  You pass out (faint).  You cannot pee. This information is not intended to replace advice given to you by your health care provider. Make sure you discuss any questions you have with your health care provider. Document Released: 12/12/2007 Document Revised: 03/13/2016 Document Reviewed: 03/13/2016 Elsevier Interactive Patient Education  2019 [employer].

## 2018-10-09 NOTE — Progress Notes (Signed)
Subjective: CC: renal stone PCP: [name] Norlander, DO WNU:UVOZD D [name] [name] is a 53 y.o. male presenting to clinic today for:  1. Renal stone Patient reports onset of symptoms about 1.5 weeks ago.  He did pass a stone and feels like he has another.  Pain is left-sided and radiates to the testicles.  He reports hematuria.  Denies any nausea, vomiting, fevers or chills.  He sees [name] [name] for urology and takes Flomax daily.  He has been using over-the-counter products with little improvement in symptoms as well.   ROS: Per HPI  No Known Allergies Past Medical History:  Diagnosis Date  . Anxiety   . Bulging discs   . Chronic kidney disease    kidney stones  . Depression   . Hypertension   . Kidney stones     Current Outpatient Medications:  .  amLODipine-benazepril (LOTREL) 5-10 MG capsule, Take 1 capsule by mouth every morning., Disp: 90 capsule, Rfl: 3 .  sertraline (ZOLOFT) 100 MG tablet, Take 1.5 tablets (150 mg total) by mouth daily., Disp: 135 tablet, Rfl: 3 .  tamsulosin (FLOMAX) 0.4 MG CAPS capsule, TAKE 1 CAPSULE (0.4 MG TOTAL) BY MOUTH EVERY MORNING., Disp: 90 capsule, Rfl: 1 Social History   Socioeconomic History  . Marital status: Married    Spouse name: Not on file  . Number of children: 1  . Years of education: Not on file  . Highest education level: Not on file  Occupational History  . Occupation: Office manager  Social Needs  . Financial resource strain: Not on file  . Food insecurity:    Worry: Not on file    Inability: Not on file  . Transportation needs:    Medical: Not on file    Non-medical: Not on file  Tobacco Use  . Smoking status: Current Every Day Smoker    Packs/day: 1.00    Years: 21.00    Pack years: 21.00    Types: Cigarettes    Start date: 12/31/1992  . Smokeless tobacco: Never Used  Substance and Sexual Activity  . Alcohol use: Yes    Comment: occasional-1-2 drinks every 2 weeks  . Drug use: No  . Sexual activity:  Not Currently  Lifestyle  . Physical activity:    Days per week: Not on file    Minutes per session: Not on file  . Stress: Not on file  Relationships  . Social connections:    Talks on phone: Not on file    Gets together: Not on file    Attends religious service: Not on file    Active member of club or organization: Not on file    Attends meetings of clubs or organizations: Not on file    Relationship status: Not on file  . Intimate partner violence:    Fear of current or ex partner: Not on file    Emotionally abused: Not on file    Physically abused: Not on file    Forced sexual activity: Not on file  Other Topics Concern  . Not on file  Social History Narrative  . Not on file   Family History  Problem Relation Age of Onset  . Congestive Heart Failure Mother   . ALS Father   . Heart attack Brother 42  . Healthy Son     Objective: Office vital signs reviewed. BP (!) 140/91   Pulse 76   Temp 97.8 F (36.6 C) (Oral)   Ht 5\' 11"  (1.803 m)  Wt 184 lb (83.5 kg)   BMI 25.66 kg/m   Physical Examination:  General: Awake, alert, nontoxic, No acute distress HEENT: Normal, sclera white GU: no CVA TTP. Mild L sided TTP.  Assessment/ Plan: 53 y.o. male   1. Nephrolithiasis Urinalysis with 2+ blood and trace leukocytes.  Urine microscopy with 11-30 red blood cells, 0-5 white blood cells and 0-10 epithelial cells no bacteria noted.  No CVA tenderness on exam.  I think that he is likely in the process of passing the stone but is having significant pain from passage of the stone.  I have advised him to increase the Flomax to 0.8 mg daily.  I have added Norco 5 mg to use every 6 hours if needed for severe pain.  National narcotic database was reviewed and there were no red flags.  Caution sedation with Norco.  We discussed reasons for emergent evaluation emergency department patient voiced good understanding.  If symptoms are not significantly improving come Monday or he develops  any other worrisome symptoms or signs, he is to seek reevaluation.  Ideally, if he can see his urologist this would be beneficial so they may evaluate him under ultrasound.  Check renal function. - Urinalysis, Complete - HYDROcodone-acetaminophen (NORCO) 5-325 MG tablet; Take 1 tablet by mouth every 6 (six) hours as needed for severe pain.  Dispense: 20 tablet; Refill: 0 - Basic Metabolic Panel   Orders Placed This Encounter  Procedures  . Microscopic Examination  . Urinalysis, Complete  . Basic Metabolic Panel   No orders of the defined types were placed in this encounter.    [name] Norlander, DO [place] [phone]

## 2018-10-10 LAB — BASIC METABOLIC PANEL
BUN/Creatinine Ratio: 14 (ref 9–20)
BUN: 12 mg/dL (ref 6–24)
CO2: 21 mmol/L (ref 20–29)
Calcium: 8.9 mg/dL (ref 8.7–10.2)
Chloride: 103 mmol/L (ref 96–106)
Creatinine, Ser: 0.85 mg/dL (ref 0.76–1.27)
GFR calc Af Amer: 114 mL/min/{1.73_m2} (ref >59)
GFR calc non Af Amer: 99 mL/min/{1.73_m2} (ref >59)
Glucose: 93 mg/dL (ref 65–99)
Potassium: 4.1 mmol/L (ref 3.5–5.2)
Sodium: 141 mmol/L (ref 134–144)

## 2018-12-02 ENCOUNTER — Telehealth: Payer: Self-pay | Admitting: *Deleted

## 2018-12-02 NOTE — Telephone Encounter (Signed)
Patient verbally consented for telehealth visit with [facility] and understands that his insurance company will be billed for the encounter. Aware to have vitals available

## 2018-12-05 ENCOUNTER — Telehealth: Payer: BLUE CROSS/BLUE SHIELD | Admitting: Cardiovascular Disease

## 2019-01-14 ENCOUNTER — Other Ambulatory Visit: Payer: Self-pay | Admitting: Family Medicine

## 2019-01-14 DIAGNOSIS — N200 Calculus of kidney: Secondary | ICD-10-CM

## 2019-01-14 NOTE — Telephone Encounter (Signed)
Last visit in April. Please advise on refill.

## 2019-01-19 ENCOUNTER — Ambulatory Visit (INDEPENDENT_AMBULATORY_CARE_PROVIDER_SITE_OTHER): Payer: BC Managed Care – PPO | Admitting: Family Medicine

## 2019-01-19 DIAGNOSIS — M25511 Pain in right shoulder: Secondary | ICD-10-CM | POA: Diagnosis not present

## 2019-01-19 DIAGNOSIS — G8929 Other chronic pain: Secondary | ICD-10-CM | POA: Diagnosis not present

## 2019-01-19 MED ORDER — MELOXICAM 15 MG PO TABS: 15.0000 mg | ORAL_TABLET | Freq: Every day | ORAL | 0 refills | Status: DC

## 2019-01-19 MED ORDER — HYDROCODONE-ACETAMINOPHEN 5-325 MG PO TABS: 1.0000 | ORAL_TABLET | Freq: Four times a day (QID) | ORAL | 0 refills | Status: DC | PRN

## 2019-01-19 NOTE — Progress Notes (Signed)
Telephone visit  Subjective: CC: shoulder pain PCP: [name], DO [name] [name] is a 52 y.o. male calls for telephone consult today. Patient provides verbal consent for consult held via phone.  Location of patient: work Location of provider: [facility] Others present for call: none  1. Right shoulder pain Patient reports long standing history of right shoulder pain.  He has history of dislocation on that side before as well as a broken collar bone before.  He notes he is having difficulty raising it now.  There was concern for possible SLAP tear vs inferior labral pathology vs rotator cuff tear.  He reports 90% of work is overhead.  He does feel that he has weakness, even with lifting things like a can of Coke.  He has some sensation change in that right hand.  He notes that he never had the MRI done secondary to financial restrictions and insurance restrictions.  He thinks that he has to do something about it now though because things are just getting worse.  He had a couple of tablets left over of Norco from his last kidney stone and he took this.  This did help some but did not fully resolve.  He has been taking over-the-counter analgesics as well.   ROS: Per HPI  No Known Allergies Past Medical History:  Diagnosis Date  . Anxiety   . Bulging discs   . Chronic kidney disease    kidney stones  . Depression   . Hypertension   . Kidney stones     Current Outpatient Medications:  .  amLODipine-benazepril (LOTREL) 5-10 MG capsule, Take 1 capsule by mouth every morning., Disp: 90 capsule, Rfl: 3 .  HYDROcodone-acetaminophen (NORCO) 5-325 MG tablet, Take 1 tablet by mouth every 6 (six) hours as needed for severe pain., Disp: 20 tablet, Rfl: 0 .  sertraline (ZOLOFT) 100 MG tablet, Take 1.5 tablets (150 mg total) by mouth daily., Disp: 135 tablet, Rfl: 3 .  tamsulosin (FLOMAX) 0.4 MG CAPS capsule, TAKE 1 CAPSULE (0.4 MG TOTAL) BY MOUTH EVERY MORNING., Disp: 90 capsule, Rfl:  1  Assessment/ Plan: 53 y.o. male   1. Chronic right shoulder pain Previously there was concern for possible SLAP tear versus inferior labral pathology versus rotator cuff tear.  He certainly likely benefits from a MRI to further evaluate.  I placed a referral back to his orthopedic surgeon, Dr. Marlou Sa.  I have also sent in Mobic to use daily for pain and inflammation.  We discussed using this away from the Zoloft.  I have sent in a small quantity of Norco as well to have on hand as needed severe pain.  Caution sedation.  Avoid heavy machinery, driving.  Take with stool softener. The Narcotic Database has been reviewed.  There were no red flags.   - HYDROcodone-acetaminophen (NORCO) 5-325 MG tablet; Take 1 tablet by mouth every 6 (six) hours as needed for severe pain.  Dispense: 20 tablet; Refill: 0 - meloxicam (MOBIC) 15 MG tablet; Take 1 tablet (15 mg total) by mouth daily. (if needed for pain)  Dispense: 30 tablet; Refill: 0 - Ambulatory referral to Orthopedic Surgery   Start time: 12:59pm End time: 1:07pm  Total time spent on patient care (including telephone call/ virtual visit): 15 minutes  [place], [name] [phone]

## 2019-01-19 NOTE — Patient Instructions (Signed)
Rotator Cuff Tear  A rotator cuff tear is a partial or complete tear of the cord-like bands (tendons) that connect muscle to bone in the rotator cuff. The rotator cuff is a group of muscles and tendons that surround the shoulder joint and keep the upper arm bone (humerus) in the shoulder socket. The tear can occur suddenly (acute tear) or can develop over a long period of time (chronic tear). What are the causes? Acute tears may be caused by:  A fall, especially on an outstretched arm.  Lifting very heavy objects with a jerking motion. Chronic tears may be caused by overuse of the muscles. This may happen in sports, physical work, or activities in which your arm repeatedly moves over your head. What increases the risk? This condition is more likely to occur in:  Athletes and workers who frequently use their shoulder or reach over their heads. This may include activities such as: ? Tennis. ? Baseball and softball. ? Swimming and rowing. ? Weightlifting. ? Construction work. ? Painting.  People who smoke.  Older people who have arthritis or poor blood supply. These can make the muscles and tendons weaker. What are the signs or symptoms? Symptoms of this condition depend on the type and severity of the injury:  An acute tear may include a sudden tearing feeling, followed by severe pain that goes from your upper shoulder, down your arm, and toward your elbow.  A chronic tear includes a gradual weakness and decreased shoulder motion as the pain gets worse. The pain is usually worse at night. Both types may have symptoms such as:  Pain that spreads (radiates) from the shoulder to the upper arm.  Swelling and tenderness in front of the shoulder.  Decreased range of motion.  Pain when: ? Reaching, pulling, or lifting the arm above the head. ? Lowering the arm from above the head.  Not being able to raise your arm out to the side.  Difficulty placing the arm behind your back. How  is this diagnosed? This condition is diagnosed with a medical history and physical exam. Imaging tests may also be done, including:  X-rays.  MRI.  Ultrasound.  CT or MR arthrogram. During this test, a contrast material is injected into your shoulder and then images are taken. How is this treated? Treatment for this condition depends on the type and severity of the condition. In less severe cases, treatment may include:  Rest. This may be done with a sling that holds the shoulder still (immobilization). Your health care provider may also recommend avoiding activities that involve lifting your arm over your head.  Icing the shoulder.  Anti-inflammatory medicines, such as aspirin or ibuprofen.  Strengthening and stretching exercises. Your health care provider may recommend specific exercises to improve your range of motion and strengthen your shoulder. In more severe cases, treatment may include:  Physical therapy.  Steroid injections.  Surgery. Follow these instructions at home: Managing pain, stiffness, and swelling  If directed, put ice on the injured area. ? If you have a removable sling, remove it as told by your health care provider. ? Put ice in a plastic bag. ? Place a towel between your skin and the bag. ? Leave the ice on for 20 minutes, 2-3 times a day.  Raise (elevate) the injured area above the level of your heart while you are lying down.  Find a comfortable sleeping position or sleep on a recliner, if available.  Move your fingers often to avoid stiffness   and to lessen swelling.  Once the swelling has gone down, your health care provider may direct you to apply heat to relax the muscles. Use the heat source that your health care provider recommends, such as a moist heat pack or a heating pad. ? Place a towel between your skin and the heat source. ? Leave the heat on for 20-30 minutes. ? Remove the heat if your skin turns bright red. This is especially  important if you are unable to feel pain, heat, or cold. You may have a greater risk of getting burned. If you have a sling:  Wear the sling as told by your health care provider. Remove it only as told by your health care provider.  Loosen the sling if your fingers tingle, become numb, or turn cold and blue.  Keep the sling clean.  If the sling is not waterproof: ? Do not let it get wet. ? Cover it with a watertight covering when you take a bath or a shower. Driving  Do not drive or use heavy machinery while taking prescription pain medicine.  Ask your health care provider when it is safe to drive if you have a sling on your arm. Activity  Rest your shoulder as told by your health care provider.  Return to your normal activities as told by your health care provider. Ask your health care provider what activities are safe for you.  Do any exercises or stretches as told by your health care provider. General instructions  Do not use any products that contain nicotine or tobacco, such as cigarettes and e-cigarettes. If you need help quitting, ask your health care provider.  Take over-the-counter and prescription medicines only as told by your health care provider.  Keep all follow-up visits as told by your health care provider. This is important. Contact a health care provider if:  Your pain gets worse.  You have new pain in your arm, hands, or fingers.  Medicine does not help your pain. Get help right away if:  Your arm, hand, or fingers are numb or tingling.  Your arm, hand, or fingers are swollen or painful or they turn white or blue.  Your hand or fingers on your injured arm are colder than your other hand. Summary  A rotator cuff tear is a partial or complete tear of the cord-like bands (tendons) that connect muscle to bone in the rotator cuff.  The tear can occur suddenly (acute tear) or can develop over a long period of time (chronic tear).  Treatment generally  includes rest, anti-inflammatory medicines, and icing. In some cases, physical therapy and steroid injections may be needed. In severe cases, surgery may be needed. This information is not intended to replace advice given to you by your health care provider. Make sure you discuss any questions you have with your health care provider. Document Released: 06/22/2000 Document Revised: 06/07/2017 Document Reviewed: 09/10/2016 Elsevier Patient Education  2020 [employer]  

## 2019-01-23 DIAGNOSIS — M25511 Pain in right shoulder: Secondary | ICD-10-CM | POA: Diagnosis not present

## 2019-01-26 DIAGNOSIS — M25511 Pain in right shoulder: Secondary | ICD-10-CM | POA: Diagnosis not present

## 2019-01-26 DIAGNOSIS — M75101 Unspecified rotator cuff tear or rupture of right shoulder, not specified as traumatic: Secondary | ICD-10-CM | POA: Diagnosis not present

## 2019-02-16 DIAGNOSIS — G8918 Other acute postprocedural pain: Secondary | ICD-10-CM | POA: Diagnosis not present

## 2019-02-16 DIAGNOSIS — M75120 Complete rotator cuff tear or rupture of unspecified shoulder, not specified as traumatic: Secondary | ICD-10-CM | POA: Insufficient documentation

## 2019-02-16 DIAGNOSIS — X58XXXA Exposure to other specified factors, initial encounter: Secondary | ICD-10-CM | POA: Diagnosis not present

## 2019-02-16 DIAGNOSIS — M7541 Impingement syndrome of right shoulder: Secondary | ICD-10-CM | POA: Diagnosis not present

## 2019-02-16 DIAGNOSIS — M7551 Bursitis of right shoulder: Secondary | ICD-10-CM | POA: Diagnosis not present

## 2019-02-16 DIAGNOSIS — S43431A Superior glenoid labrum lesion of right shoulder, initial encounter: Secondary | ICD-10-CM | POA: Diagnosis not present

## 2019-02-16 DIAGNOSIS — M75121 Complete rotator cuff tear or rupture of right shoulder, not specified as traumatic: Secondary | ICD-10-CM | POA: Diagnosis not present

## 2019-02-16 DIAGNOSIS — M659 Synovitis and tenosynovitis, unspecified: Secondary | ICD-10-CM | POA: Diagnosis not present

## 2019-02-16 DIAGNOSIS — Y999 Unspecified external cause status: Secondary | ICD-10-CM | POA: Diagnosis not present

## 2019-02-16 DIAGNOSIS — M7521 Bicipital tendinitis, right shoulder: Secondary | ICD-10-CM | POA: Diagnosis not present

## 2019-03-02 DIAGNOSIS — M25511 Pain in right shoulder: Secondary | ICD-10-CM | POA: Diagnosis not present

## 2019-03-20 DIAGNOSIS — M25511 Pain in right shoulder: Secondary | ICD-10-CM | POA: Diagnosis not present

## 2019-03-24 DIAGNOSIS — M25511 Pain in right shoulder: Secondary | ICD-10-CM | POA: Diagnosis not present

## 2019-03-26 DIAGNOSIS — M25511 Pain in right shoulder: Secondary | ICD-10-CM | POA: Diagnosis not present

## 2019-03-31 DIAGNOSIS — M25511 Pain in right shoulder: Secondary | ICD-10-CM | POA: Diagnosis not present

## 2019-04-02 DIAGNOSIS — M25511 Pain in right shoulder: Secondary | ICD-10-CM | POA: Diagnosis not present

## 2019-04-07 DIAGNOSIS — M25511 Pain in right shoulder: Secondary | ICD-10-CM | POA: Diagnosis not present

## 2019-04-09 DIAGNOSIS — M25511 Pain in right shoulder: Secondary | ICD-10-CM | POA: Diagnosis not present

## 2019-04-14 DIAGNOSIS — M25511 Pain in right shoulder: Secondary | ICD-10-CM | POA: Diagnosis not present

## 2019-04-16 DIAGNOSIS — M25511 Pain in right shoulder: Secondary | ICD-10-CM | POA: Diagnosis not present

## 2019-04-21 DIAGNOSIS — M25511 Pain in right shoulder: Secondary | ICD-10-CM | POA: Diagnosis not present

## 2019-04-23 DIAGNOSIS — M25511 Pain in right shoulder: Secondary | ICD-10-CM | POA: Diagnosis not present

## 2019-04-30 DIAGNOSIS — M25511 Pain in right shoulder: Secondary | ICD-10-CM | POA: Diagnosis not present

## 2019-05-07 DIAGNOSIS — M25511 Pain in right shoulder: Secondary | ICD-10-CM | POA: Diagnosis not present

## 2019-05-10 HISTORY — PX: SHOULDER SURGERY: SHX246

## 2019-07-09 DIAGNOSIS — M25511 Pain in right shoulder: Secondary | ICD-10-CM | POA: Diagnosis not present

## 2019-07-17 ENCOUNTER — Encounter: Payer: Self-pay | Admitting: Family Medicine

## 2019-07-17 ENCOUNTER — Ambulatory Visit (INDEPENDENT_AMBULATORY_CARE_PROVIDER_SITE_OTHER): Payer: BC Managed Care – PPO | Admitting: Family Medicine

## 2019-07-17 DIAGNOSIS — N200 Calculus of kidney: Secondary | ICD-10-CM

## 2019-07-17 MED ORDER — HYDROCODONE-ACETAMINOPHEN 5-325 MG PO TABS: 1.0000 | ORAL_TABLET | Freq: Four times a day (QID) | ORAL | 0 refills | Status: AC | PRN

## 2019-07-17 NOTE — Progress Notes (Signed)
Virtual Visit via Telephone Note  I connected with [name] [name] on 07/17/19 at 3:09 PM by telephone and verified that I am speaking with the correct person using two identifiers. [name] ENGLIN is currently located at home and nobody is currently with him during this visit. The provider, Loman Brooklyn, FNP is located in their home at time of visit.  I discussed the limitations, risks, security and privacy concerns of performing an evaluation and management service by telephone and the availability of in person appointments. I also discussed with the patient that there may be a patient responsible charge related to this service. The patient expressed understanding and agreed to proceed.  Subjective: PCP: [name], DO  Chief Complaint  Patient presents with  . Nephrolithiasis   Patient reports yesterday he was experiencing some right sided pain.  Today the pain has moved down into his privates/testicles.  He has a significant history of kidney stones reporting that he has had probably 30-40 in his life.  He is positive that is what this is.  He does have Flomax 0.4 mg that he takes religiously every day.  He is just in a lot of pain and hoping to get something to help with the pain while he passes this.   ROS: Per HPI  Current Outpatient Medications:  .  amLODipine-benazepril (LOTREL) 5-10 MG capsule, Take 1 capsule by mouth every morning., Disp: 90 capsule, Rfl: 3 .  HYDROcodone-acetaminophen (NORCO) 5-325 MG tablet, Take 1 tablet by mouth every 6 (six) hours as needed for severe pain., Disp: 20 tablet, Rfl: 0 .  meloxicam (MOBIC) 15 MG tablet, Take 1 tablet (15 mg total) by mouth daily. (if needed for pain), Disp: 30 tablet, Rfl: 0 .  sertraline (ZOLOFT) 100 MG tablet, Take 1.5 tablets (150 mg total) by mouth daily., Disp: 135 tablet, Rfl: 3 .  tamsulosin (FLOMAX) 0.4 MG CAPS capsule, TAKE 1 CAPSULE (0.4 MG TOTAL) BY MOUTH EVERY MORNING., Disp: 90 capsule, Rfl: 1  No  Known Allergies Past Medical History:  Diagnosis Date  . Anxiety   . Bulging discs   . Chronic kidney disease    kidney stones  . Depression   . Hypertension   . Kidney stones     Observations/Objective: A&O  No respiratory distress or wheezing audible over the phone Mood, judgement, and thought processes all WNL  Assessment and Plan: 1. Nephrolithiasis - Patient to continue Flomax 0.4 mg once daily.  I have given him a prescription for Norco 5-325 to help with the pain.  We discussed that if he is unable to pass the stone he needs to get in with his urologist.  Patient verbalizes understanding and states he will call them early next week if he has not yet passed the stone. - HYDROcodone-acetaminophen (NORCO) 5-325 MG tablet; Take 1 tablet by mouth every 6 (six) hours as needed for up to 5 days for moderate pain.  Dispense: 20 tablet; Refill: 0   Follow Up Instructions:  I discussed the assessment and treatment plan with the patient. The patient was provided an opportunity to ask questions and all were answered. The patient agreed with the plan and demonstrated an understanding of the instructions.   The patient was advised to call back or seek an in-person evaluation if the symptoms worsen or if the condition fails to improve as anticipated.  The above assessment and management plan was discussed with the patient. The patient verbalized understanding of and has agreed  to the management plan. Patient is aware to call the clinic if symptoms persist or worsen. Patient is aware when to return to the clinic for a follow-up visit. Patient educated on when it is appropriate to go to the emergency department.   Time call ended: 3:14 PM  I provided 11 minutes of non-face-to-face time during this encounter.  Hendricks Limes, MSN, APRN, FNP-C [facility] 07/17/19

## 2019-09-22 ENCOUNTER — Other Ambulatory Visit: Payer: Self-pay | Admitting: Family Medicine

## 2019-09-22 DIAGNOSIS — I10 Essential (primary) hypertension: Secondary | ICD-10-CM

## 2019-09-23 ENCOUNTER — Other Ambulatory Visit: Payer: Self-pay | Admitting: Family Medicine

## 2019-09-23 DIAGNOSIS — N200 Calculus of kidney: Secondary | ICD-10-CM

## 2019-10-13 ENCOUNTER — Other Ambulatory Visit: Payer: BC Managed Care – PPO

## 2019-10-13 ENCOUNTER — Encounter: Payer: Self-pay | Admitting: Family Medicine

## 2019-10-13 ENCOUNTER — Other Ambulatory Visit: Payer: Self-pay

## 2019-10-13 ENCOUNTER — Ambulatory Visit (INDEPENDENT_AMBULATORY_CARE_PROVIDER_SITE_OTHER): Payer: BC Managed Care – PPO

## 2019-10-13 ENCOUNTER — Telehealth (INDEPENDENT_AMBULATORY_CARE_PROVIDER_SITE_OTHER): Payer: BC Managed Care – PPO | Admitting: Family Medicine

## 2019-10-13 DIAGNOSIS — R109 Unspecified abdominal pain: Secondary | ICD-10-CM | POA: Diagnosis not present

## 2019-10-13 DIAGNOSIS — Z87442 Personal history of urinary calculi: Secondary | ICD-10-CM | POA: Diagnosis not present

## 2019-10-13 DIAGNOSIS — N200 Calculus of kidney: Secondary | ICD-10-CM | POA: Diagnosis not present

## 2019-10-13 MED ORDER — HYDROCODONE-ACETAMINOPHEN 5-325 MG PO TABS: 1.0000 | ORAL_TABLET | Freq: Four times a day (QID) | ORAL | 0 refills | Status: DC | PRN

## 2019-10-13 NOTE — Progress Notes (Signed)
Virtual Visit via Video note  I connected with [name] [name] on 10/13/19 at 3:10 PM by video and verified that I am speaking with the correct person using two identifiers. [name] [name] is currently located at home and his wife is currently with him during visit. The provider, Loman Brooklyn, FNP is located in their home at time of visit.  I discussed the limitations, risks, security and privacy concerns of performing an evaluation and management service by video and the availability of in person appointments. I also discussed with the patient that there may be a patient responsible charge related to this service. The patient expressed understanding and agreed to proceed.  Subjective: PCP: [name] Norlander, DO  Chief Complaint  Patient presents with  . Nephrolithiasis   Patient has a significant history of kidney stones. He is currently experiencing right sided pain that radiates down into his testicles, which is how his stones present. He reports in the past couple of days the pain has become unbearable. He had to leave work early today due to the pain. He takes Flomax on a daily basis. He has an appointment with urology, but not until the later part of the month.    ROS: Per HPI  Current Outpatient Medications:  .  amLODipine-benazepril (LOTREL) 5-10 MG capsule, Take 1 capsule by mouth daily. (Needs to be seen before next refill), Disp: 30 capsule, Rfl: 0 .  sertraline (ZOLOFT) 100 MG tablet, Take 1.5 tablets (150 mg total) by mouth daily., Disp: 135 tablet, Rfl: 3 .  tamsulosin (FLOMAX) 0.4 MG CAPS capsule, TAKE 1 CAPSULE (0.4 MG TOTAL) BY MOUTH EVERY MORNING., Disp: 90 capsule, Rfl: 2  No Known Allergies Past Medical History:  Diagnosis Date  . Anxiety   . Bulging discs   . Chronic kidney disease    kidney stones  . Depression   . Hypertension   . Kidney stones     Observations/Objective: Physical Exam Constitutional:      General: He is not in acute  distress.    Appearance: Normal appearance. He is ill-appearing. He is not toxic-appearing.  Eyes:     General: No scleral icterus.       Right eye: No discharge.        Left eye: No discharge.     Conjunctiva/sclera: Conjunctivae normal.  Pulmonary:     Effort: Pulmonary effort is normal. No respiratory distress.  Neurological:     Mental Status: He is alert and oriented to person, place, and time.  Psychiatric:        Mood and Affect: Mood normal.        Behavior: Behavior normal.        Thought Content: Thought content normal.        Judgment: Judgment normal.    Assessment and Plan: 1-2. Right flank pain/History of nephrolithiasis - Continue Flomax QD. Keep appointment with urology. - HYDROcodone-acetaminophen ([place]) 5-325 MG tablet; Take 1 tablet by mouth every 6 (six) hours as needed for moderate pain.  Dispense: 20 tablet; Refill: 0 - DG Abd 1 View   Follow Up Instructions:   I discussed the assessment and treatment plan with the patient. The patient was provided an opportunity to ask questions and all were answered. The patient agreed with the plan and demonstrated an understanding of the instructions.   The patient was advised to call back or seek an in-person evaluation if the symptoms worsen or if the condition fails to improve as  anticipated.  The above assessment and management plan was discussed with the patient. The patient verbalized understanding of and has agreed to the management plan. Patient is aware to call the clinic if symptoms persist or worsen. Patient is aware when to return to the clinic for a follow-up visit. Patient educated on when it is appropriate to go to the emergency department.   Time call ended: 3:17 PM  I provided 9 minutes of face-to-face time during this encounter.   Hendricks Limes, MSN, APRN, FNP-C [facility] 10/13/19

## 2019-10-27 ENCOUNTER — Other Ambulatory Visit: Payer: Self-pay | Admitting: Family Medicine

## 2019-10-27 DIAGNOSIS — I10 Essential (primary) hypertension: Secondary | ICD-10-CM

## 2019-10-27 DIAGNOSIS — R1084 Generalized abdominal pain: Secondary | ICD-10-CM | POA: Diagnosis not present

## 2019-10-27 DIAGNOSIS — R310 Gross hematuria: Secondary | ICD-10-CM | POA: Diagnosis not present

## 2019-10-27 DIAGNOSIS — N202 Calculus of kidney with calculus of ureter: Secondary | ICD-10-CM | POA: Diagnosis not present

## 2020-03-03 ENCOUNTER — Other Ambulatory Visit: Payer: Self-pay | Admitting: Family Medicine

## 2020-03-03 DIAGNOSIS — I10 Essential (primary) hypertension: Secondary | ICD-10-CM

## 2020-03-10 ENCOUNTER — Ambulatory Visit: Payer: BC Managed Care – PPO | Admitting: Family Medicine

## 2020-03-18 ENCOUNTER — Ambulatory Visit (INDEPENDENT_AMBULATORY_CARE_PROVIDER_SITE_OTHER): Payer: BC Managed Care – PPO | Admitting: Nurse Practitioner

## 2020-03-18 ENCOUNTER — Other Ambulatory Visit: Payer: Self-pay

## 2020-03-18 ENCOUNTER — Encounter: Payer: Self-pay | Admitting: Nurse Practitioner

## 2020-03-18 VITALS — BP 139/93 | HR 72 | Temp 97.8°F | Resp 20 | Ht 71.0 in | Wt 170.0 lb

## 2020-03-18 DIAGNOSIS — F411 Generalized anxiety disorder: Secondary | ICD-10-CM | POA: Diagnosis not present

## 2020-03-18 DIAGNOSIS — I10 Essential (primary) hypertension: Secondary | ICD-10-CM | POA: Diagnosis not present

## 2020-03-18 MED ORDER — AMLODIPINE BESY-BENAZEPRIL HCL 5-10 MG PO CAPS: 1.0000 | ORAL_CAPSULE | Freq: Every day | ORAL | 2 refills | Status: DC

## 2020-03-18 NOTE — Patient Instructions (Signed)

## 2020-03-18 NOTE — Progress Notes (Signed)
Established Patient Office Visit  Subjective:  Patient ID: [name] [name], male    DOB: 1966/04/27  Age: 54 y.o. MRN: [id number]  CC:  Chief Complaint  Patient presents with  . Hypertension  . Medication Refill    HPI [name] [name] presents for Pt presents for follow up of hypertension. Patient was diagnosed in 2016. The patient is tolerating the medication well without side effects. Compliance with treatment has been good; including taking medication as directed , maintains a healthy diet and regular exercise regimen , and following up as directed. Current medication Lotrel 5-10 mg 1 tablet by mouth daily.  Patient has not had medication in the last 3 weeks.   Anxiety, Follow-up  He was last seen for anxiety 4 years ago. Changes made at last visit include: no changes made.   He reports excellent compliance with treatment. He reports excellent tolerance of treatment. He is not having side effects.   He feels his anxiety is mild and Improved since last visit.  Symptoms: No chest pain No difficulty concentrating  No dizziness No fatigue  No feelings of losing control No insomnia  No irritable No palpitations  No panic attacks No racing thoughts  No shortness of breath No sweating  No tremors/shakes    GAD-7 Results GAD-7 Generalized Anxiety Disorder Screening Tool 03/18/2020 06/27/2018  1. Feeling Nervous, Anxious, or on Edge 0 1  2. Not Being Able to Stop or Control Worrying 0 1  3. Worrying Too Much About Different Things 0 2  4. Trouble Relaxing 0 1  5. Being So Restless it's Hard To Sit Still 0 2  6. Becoming Easily Annoyed or Irritable 0 1  7. Feeling Afraid As If Something Awful Might Happen 0 0  Total GAD-7 Score 0 8  Difficulty At Work, Home, or Getting  Along With Others? Not difficult at all Somewhat difficult    PHQ-9 Scores PHQ9 SCORE ONLY 03/18/2020 06/27/2018 02/22/2017  PHQ-9 Total Score 0 1 0     --------------------------------------------------------------------------------------------------- ---------------------------------------------------------------------------------------------------   Past Medical History:  Diagnosis Date  . Anxiety   . Bulging discs   . Chronic kidney disease    kidney stones  . Depression   . Hypertension   . Kidney stones     Past Surgical History:  Procedure Laterality Date  . CYSTOSCOPY W/ URETERAL STENT PLACEMENT  09-2011  . CYSTOSCOPY W/ URETERAL STENT REMOVAL  10-2011  . CYSTOSCOPY WITH RETROGRADE PYELOGRAM, URETEROSCOPY AND STENT PLACEMENT Bilateral 03/26/2014   Procedure: CYSTOSCOPY WITH RETROGRADE PYELOGRAM, URETEROSCOPY AND STENT PLACEMENT;  Surgeon: Bernestine Amass, MD;  Location: [facility];  Service: Urology;  Laterality: Bilateral;  . HEMORRHOID SURGERY N/A 09/03/2013   Procedure: HEMORRHOIDECTOMY;  Surgeon: Leighton Ruff, MD;  Location: [facility];  Service: General;  Laterality: N/A;  . HOLMIUM LASER APPLICATION Bilateral 11/23/15   Procedure: HOLMIUM LASER APPLICATION;  Surgeon: Bernestine Amass, MD;  Location: [facility];  Service: Urology;  Laterality: Bilateral;  . LITHOTRIPSY    . SHOULDER SURGERY Right 05/2019  . STONE EXTRACTION WITH BASKET Left 03/26/2014   Procedure: STONE EXTRACTION WITH BASKET;  Surgeon: Bernestine Amass, MD;  Location: [facility];  Service: Urology;  Laterality: Left;    Family History  Problem Relation Age of Onset  . Congestive Heart Failure Mother   . ALS Father   . Heart attack Brother 23  . Healthy Son     Social History   Socioeconomic History  . Marital status: Married  Spouse name: Not on file  . Number of children: 1  . Years of education: Not on file  . Highest education level: Not on file  Occupational History  . Occupation: Office manager  Tobacco Use  . Smoking status: Current Every Day Smoker    Packs/day: 1.00    Years: 21.00    Pack years: 21.00    Types: Cigarettes     Start date: 12/31/1992  . Smokeless tobacco: Never Used  Vaping Use  . Vaping Use: Never used  Substance and Sexual Activity  . Alcohol use: Yes    Comment: occasional-1-2 drinks every 2 weeks  . Drug use: No  . Sexual activity: Not Currently  Other Topics Concern  . Not on file  Social History Narrative  . Not on file   Social Determinants of Health   Financial Resource Strain:   . Difficulty of Paying Living Expenses: Not on file  Food Insecurity:   . Worried About Charity fundraiser in the Last Year: Not on file  . Ran Out of Food in the Last Year: Not on file  Transportation Needs:   . Lack of Transportation (Medical): Not on file  . Lack of Transportation (Non-Medical): Not on file  Physical Activity:   . Days of Exercise per Week: Not on file  . Minutes of Exercise per Session: Not on file  Stress:   . Feeling of Stress : Not on file  Social Connections:   . Frequency of Communication with Friends and Family: Not on file  . Frequency of Social Gatherings with Friends and Family: Not on file  . Attends Religious Services: Not on file  . Active Member of Clubs or Organizations: Not on file  . Attends Archivist Meetings: Not on file  . Marital Status: Not on file  Intimate Partner Violence:   . Fear of Current or Ex-Partner: Not on file  . Emotionally Abused: Not on file  . Physically Abused: Not on file  . Sexually Abused: Not on file    Outpatient Medications Prior to Visit  Medication Sig Dispense Refill  . sertraline (ZOLOFT) 100 MG tablet Take 1.5 tablets (150 mg total) by mouth daily. 135 tablet 3  . tamsulosin (FLOMAX) 0.4 MG CAPS capsule TAKE 1 CAPSULE (0.4 MG TOTAL) BY MOUTH EVERY MORNING. 90 capsule 2  . amLODipine-benazepril (LOTREL) 5-10 MG capsule Take 1 capsule by mouth daily. (Needs to be seen before next refill) 30 capsule 0  . HYDROcodone-acetaminophen (NORCO) 5-325 MG tablet Take 1 tablet by mouth every 6 (six) hours as needed for  moderate pain. 20 tablet 0   No facility-administered medications prior to visit.    No Known Allergies  ROS Review of Systems  All other systems reviewed and are negative.     Objective:    Physical Exam Vitals reviewed.  Constitutional:      Appearance: Normal appearance.  HENT:     Head: Normocephalic.     Nose: Nose normal.  Eyes:     Conjunctiva/sclera: Conjunctivae normal.  Cardiovascular:     Rate and Rhythm: Normal rate and regular rhythm.     Pulses: Normal pulses.     Heart sounds: Normal heart sounds.  Pulmonary:     Effort: Pulmonary effort is normal.     Breath sounds: Normal breath sounds.  Abdominal:     General: Bowel sounds are normal.  Musculoskeletal:        General: Normal range of motion.  Cervical back: Normal range of motion.  Skin:    General: Skin is warm.  Neurological:     Mental Status: He is alert and oriented to person, place, and time.  Psychiatric:        Mood and Affect: Mood normal.        Behavior: Behavior normal.     BP (!) 139/93 (BP Location: Left Arm, Cuff Size: Large)   Pulse 72   Temp 97.8 F (36.6 C)   Resp 20   Ht 5\' 11"  (1.803 m)   Wt 170 lb (77.1 kg)   SpO2 97%   BMI 23.71 kg/m  Wt Readings from Last 3 Encounters:  03/18/20 170 lb (77.1 kg)  10/09/18 184 lb (83.5 kg)  09/05/18 182 lb (82.6 kg)     Health Maintenance Due  Topic Date Due  . Hepatitis C Screening  Never done  . COVID-19 Vaccine (1) Never done  . TETANUS/TDAP  Never done  . COLONOSCOPY  04/08/2019    There are no preventive care reminders to display for this patient.  Lab Results  Component Value Date   TSH 0.926 06/27/2018   Lab Results  Component Value Date   WBC 7.4 06/27/2018   HGB 15.1 06/27/2018   HCT 45.5 06/27/2018   MCV 88 06/27/2018   PLT 233 06/27/2018   Lab Results  Component Value Date   NA 141 10/09/2018   K 4.1 10/09/2018   CO2 21 10/09/2018   GLUCOSE 93 10/09/2018   BUN 12 10/09/2018   CREATININE  0.85 10/09/2018   BILITOT 0.5 06/27/2018   ALKPHOS 97 06/27/2018   AST 21 06/27/2018   ALT 17 06/27/2018   PROT 6.2 06/27/2018   ALBUMIN 4.0 06/27/2018   CALCIUM 8.9 10/09/2018   ANIONGAP 14 03/26/2014   Lab Results  Component Value Date   CHOL 156 06/27/2018   Lab Results  Component Value Date   HDL 41 06/27/2018   Lab Results  Component Value Date   LDLCALC 93 06/27/2018   Lab Results  Component Value Date   TRIG 110 06/27/2018   Lab Results  Component Value Date   CHOLHDL 3.8 06/27/2018   Lab Results  Component Value Date   HGBA1C 5.6 03/08/2016      Assessment & Plan:   Problem List Items Addressed This Visit      Cardiovascular and Mediastinum   Essential hypertension - Primary    Patient is a 55 year old male who is following up today for hypertension.  Patient was diagnosed in 2016.  He is tolerating the medication well without side effects.  Blood pressure was elevated in clinic today.  Patient reports he has not had medication in the last 3 weeks because he ran out.  Lotrel 5-10 mg 1 tablet by mouth daily refill sent to pharmacy.  Provided education to patient with printed handouts given. Advised patient to document blood pressure log for 1 week and call numbers into office.          Relevant Medications   amLODipine-benazepril (LOTREL) 5-10 MG capsule     Other   GAD (generalized anxiety disorder)    Anxiety well managed with current medication regimen no need to change medication dose.  Gad- 7 completed patient score is a 0.         Meds ordered this encounter  Medications  . amLODipine-benazepril (LOTREL) 5-10 MG capsule    Sig: Take 1 capsule by mouth daily. (Needs to be seen before  next refill)    Dispense:  30 capsule    Refill:  2    Order Specific Question:   Supervising Provider    Answer:   Caryl Pina A [[id number]]    Follow-up: Return in about 6 months (around 09/15/2020).    Ivy Lynn, NP

## 2020-03-18 NOTE — Assessment & Plan Note (Signed)
Anxiety well managed with current medication regimen no need to change medication dose.  Gad- 7 completed patient score is a 0.

## 2020-03-18 NOTE — Assessment & Plan Note (Signed)
Patient is a 54 year old male who is following up today for hypertension.  Patient was diagnosed in 2016.  He is tolerating the medication well without side effects.  Blood pressure was elevated in clinic today.  Patient reports he has not had medication in the last 3 weeks because he ran out.  Lotrel 5-10 mg 1 tablet by mouth daily refill sent to pharmacy.  Provided education to patient with printed handouts given. Advised patient to document blood pressure log for 1 week and call numbers into office.

## 2020-06-07 ENCOUNTER — Other Ambulatory Visit: Payer: Self-pay | Admitting: *Deleted

## 2020-06-07 DIAGNOSIS — I10 Essential (primary) hypertension: Secondary | ICD-10-CM

## 2020-06-07 MED ORDER — AMLODIPINE BESY-BENAZEPRIL HCL 5-10 MG PO CAPS: 1.0000 | ORAL_CAPSULE | Freq: Every day | ORAL | 2 refills | Status: DC

## 2020-06-26 ENCOUNTER — Other Ambulatory Visit: Payer: Self-pay | Admitting: Family Medicine

## 2020-06-26 DIAGNOSIS — F411 Generalized anxiety disorder: Secondary | ICD-10-CM

## 2020-06-27 ENCOUNTER — Other Ambulatory Visit: Payer: Self-pay | Admitting: Family Medicine

## 2020-06-27 ENCOUNTER — Telehealth: Payer: Self-pay

## 2020-06-27 DIAGNOSIS — F411 Generalized anxiety disorder: Secondary | ICD-10-CM

## 2020-06-27 MED ORDER — SERTRALINE HCL 100 MG PO TABS: 150.0000 mg | ORAL_TABLET | Freq: Every day | ORAL | 0 refills | Status: DC

## 2020-06-27 NOTE — Telephone Encounter (Signed)
Medication sent- wife aware 

## 2020-06-27 NOTE — Telephone Encounter (Signed)
Pts wife called stating that pt needs refills on his medicines. Was last seen by Je on 03/18/20 for med refill but the only medicine that was refilled was his BP medicine. Needs refills on his other 2 meds.  Pt uses [facility] in [place].

## 2020-09-01 ENCOUNTER — Other Ambulatory Visit: Payer: Self-pay | Admitting: Family Medicine

## 2020-09-01 DIAGNOSIS — I10 Essential (primary) hypertension: Secondary | ICD-10-CM

## 2020-09-08 DIAGNOSIS — M5136 Other intervertebral disc degeneration, lumbar region: Secondary | ICD-10-CM | POA: Diagnosis not present

## 2020-09-08 DIAGNOSIS — N200 Calculus of kidney: Secondary | ICD-10-CM | POA: Diagnosis not present

## 2020-09-08 DIAGNOSIS — I700 Atherosclerosis of aorta: Secondary | ICD-10-CM | POA: Diagnosis not present

## 2020-09-08 DIAGNOSIS — R079 Chest pain, unspecified: Secondary | ICD-10-CM | POA: Diagnosis not present

## 2020-09-08 DIAGNOSIS — R109 Unspecified abdominal pain: Secondary | ICD-10-CM | POA: Diagnosis not present

## 2020-09-19 ENCOUNTER — Other Ambulatory Visit: Payer: Self-pay | Admitting: *Deleted

## 2020-09-19 ENCOUNTER — Other Ambulatory Visit: Payer: Self-pay | Admitting: Family Medicine

## 2020-09-19 DIAGNOSIS — I10 Essential (primary) hypertension: Secondary | ICD-10-CM

## 2020-09-19 DIAGNOSIS — F411 Generalized anxiety disorder: Secondary | ICD-10-CM

## 2020-09-19 MED ORDER — AMLODIPINE BESY-BENAZEPRIL HCL 5-10 MG PO CAPS: 1.0000 | ORAL_CAPSULE | Freq: Every day | ORAL | 0 refills | Status: DC

## 2020-09-19 MED ORDER — SERTRALINE HCL 100 MG PO TABS: 150.0000 mg | ORAL_TABLET | Freq: Every day | ORAL | 0 refills | Status: DC

## 2020-10-26 ENCOUNTER — Encounter: Payer: Self-pay | Admitting: Family Medicine

## 2020-10-26 ENCOUNTER — Ambulatory Visit: Payer: BC Managed Care – PPO | Admitting: Family Medicine

## 2020-10-26 ENCOUNTER — Other Ambulatory Visit: Payer: Self-pay

## 2020-10-26 ENCOUNTER — Ambulatory Visit (INDEPENDENT_AMBULATORY_CARE_PROVIDER_SITE_OTHER): Payer: BC Managed Care – PPO

## 2020-10-26 VITALS — BP 131/73 | HR 74 | Temp 98.6°F | Ht 71.0 in | Wt 164.4 lb

## 2020-10-26 DIAGNOSIS — N50812 Left testicular pain: Secondary | ICD-10-CM

## 2020-10-26 DIAGNOSIS — N200 Calculus of kidney: Secondary | ICD-10-CM

## 2020-10-26 DIAGNOSIS — M545 Low back pain, unspecified: Secondary | ICD-10-CM

## 2020-10-26 DIAGNOSIS — R109 Unspecified abdominal pain: Secondary | ICD-10-CM

## 2020-10-26 DIAGNOSIS — R10A2 Flank pain, left side: Secondary | ICD-10-CM

## 2020-10-26 LAB — URINALYSIS, ROUTINE W REFLEX MICROSCOPIC
Bilirubin, UA: NEGATIVE
Glucose, UA: NEGATIVE
Ketones, UA: NEGATIVE
Nitrite, UA: NEGATIVE
Protein,UA: NEGATIVE
Specific Gravity, UA: 1.02 (ref 1.005–1.030)
Urobilinogen, Ur: 0.2 mg/dL (ref 0.2–1.0)
pH, UA: 6.5 (ref 5.0–7.5)

## 2020-10-26 LAB — MICROSCOPIC EXAMINATION: Epithelial Cells (non renal): NONE SEEN /hpf (ref 0–10)

## 2020-10-26 MED ORDER — IBUPROFEN 800 MG PO TABS: 800.0000 mg | ORAL_TABLET | Freq: Three times a day (TID) | ORAL | 0 refills | Status: DC | PRN

## 2020-10-26 MED ORDER — CYCLOBENZAPRINE HCL 10 MG PO TABS: 10.0000 mg | ORAL_TABLET | Freq: Three times a day (TID) | ORAL | 1 refills | Status: DC | PRN

## 2020-10-26 MED ORDER — KETOROLAC TROMETHAMINE 60 MG/2ML IM SOLN
60.0000 mg | Freq: Once | INTRAMUSCULAR | Status: AC
  Administered 2020-10-26: 60 mg via INTRAMUSCULAR

## 2020-10-26 MED ORDER — TAMSULOSIN HCL 0.4 MG PO CAPS: 0.4000 mg | ORAL_CAPSULE | Freq: Every day | ORAL | 2 refills | Status: DC

## 2020-10-26 MED ORDER — DOXYCYCLINE HYCLATE 100 MG PO TABS: 100.0000 mg | ORAL_TABLET | Freq: Two times a day (BID) | ORAL | 0 refills | Status: AC

## 2020-10-26 NOTE — Patient Instructions (Signed)
Kidney Stones  Kidney stones are solid, rock-like deposits that form inside of the kidneys. The kidneys are a pair of organs that make urine. A kidney stone may form in a kidney and move into other parts of the urinary tract, including the tubes that connect the kidneys to the bladder (ureters), the bladder, and the tube that carries urine out of the body (urethra). As the stone moves through these areas, it can cause intense pain and block the flow of urine. Kidney stones are created when high levels of certain minerals are found in the urine. The stones are usually passed out of the body through urination, but in some cases, medical treatment may be needed to remove them. What are the causes? Kidney stones may be caused by:  A condition in which certain glands produce too much parathyroid hormone (primary hyperparathyroidism), which causes too much calcium buildup in the blood.  A buildup of uric acid crystals in the bladder (hyperuricosuria). Uric acid is a chemical that the body produces when you eat certain foods. It usually exits the body in the urine.  Narrowing (stricture) of one or both of the ureters.  A kidney blockage that is present at birth (congenital obstruction).  Past surgery on the kidney or the ureters, such as gastric bypass surgery. What increases the risk? The following factors may make you more likely to develop this condition:  Having had a kidney stone in the past.  Having a family history of kidney stones.  Not drinking enough water.  Eating a diet that is high in protein, salt (sodium), or sugar.  Being overweight or obese. What are the signs or symptoms? Symptoms of a kidney stone may include:  Pain in the side of the abdomen, right below the ribs (flank pain). Pain usually spreads (radiates) to the groin.  Needing to urinate frequently or urgently.  Painful urination.  Blood in the urine (hematuria).  Nausea.  Vomiting.  Fever and chills. How  is this diagnosed? This condition may be diagnosed based on:  Your symptoms and medical history.  A physical exam.  Blood tests.  Urine tests. These may be done before and after the stone passes out of your body through urination.  Imaging tests, such as a CT scan, abdominal X-ray, or ultrasound.  A procedure to examine the inside of the bladder (cystoscopy). How is this treated? Treatment for kidney stones depends on the size, location, and makeup of the stones. Kidney stones will often pass out of the body through urination. You may need to:  Increase your fluid intake to help pass the stone. In some cases, you may be given fluids through an IV and may need to be monitored at the hospital.  Take medicine for pain.  Make changes in your diet to help prevent kidney stones from coming back. Sometimes, medical procedures are needed to remove a kidney stone. This may involve:  A procedure to break up kidney stones using: ? A focused beam of light (laser therapy). ? Shock waves (extracorporeal shock wave lithotripsy).  Surgery to remove kidney stones. This may be needed if you have severe pain or have stones that block your urinary tract. Follow these instructions at home: Medicines  Take over-the-counter and prescription medicines only as told by your health care provider.  Ask your health care provider if the medicine prescribed to you requires you to avoid driving or using heavy machinery. Eating and drinking  Drink enough fluid to keep your urine pale yellow.   You may be instructed to drink at least 8-10 glasses of water each day. This will help you pass the kidney stone.  If directed, change your diet. This may include: ? Limiting how much sodium you eat. ? Eating more fruits and vegetables. ? Limiting how much animal protein--such as red meat, poultry, fish, and eggs--you eat.  Follow instructions from your health care provider about eating or drinking  restrictions. General instructions  Collect urine samples as told by your health care provider. You may need to collect a urine sample: ? 24 hours after you pass the stone. ? 8-12 weeks after passing the kidney stone, and every 6-12 months after that.  Strain your urine every time you urinate, for as long as directed. Use the strainer that your health care provider recommends.  Do not throw out the kidney stone after passing it. Keep the stone so it can be tested by your health care provider. Testing the makeup of your kidney stone may help prevent you from getting kidney stones in the future.  Keep all follow-up visits as told by your health care provider. This is important. You may need follow-up X-rays or ultrasounds to make sure that your stone has passed. How is this prevented? To prevent another kidney stone:  Drink enough fluid to keep your urine pale yellow. This is the best way to prevent kidney stones.  Eat a healthy diet and follow recommendations from your health care provider about foods to avoid. You may be instructed to eat a low-protein diet. Recommendations vary depending on the type of kidney stone that you have.  Maintain a healthy weight.   Where to find more information  National Kidney Foundation (NKF): www.kidney.org  [facility] [facility] ([facility]): www.urologyhealth.org Contact a health care provider if:  You have pain that gets worse or does not get better with medicine. Get help right away if:  You have a fever or chills.  You develop severe pain.  You develop new abdominal pain.  You faint.  You are unable to urinate. Summary  Kidney stones are solid, rock-like deposits that form inside of the kidneys.  Kidney stones can cause nausea, vomiting, blood in the urine, abdominal pain, and the urge to urinate frequently.  Treatment for kidney stones depends on the size, location, and makeup of the stones. Kidney stones will often pass out of the body  through urination.  Kidney stones can be prevented by drinking enough fluids, eating a healthy diet, and maintaining a healthy weight. This information is not intended to replace advice given to you by your health care provider. Make sure you discuss any questions you have with your health care provider. Document Revised: 11/11/2018 Document Reviewed: 11/11/2018 Elsevier Patient Education  2021 Elsevier Inc.  

## 2020-10-26 NOTE — Progress Notes (Signed)
Acute Office Visit  Subjective:    Patient ID: [name] [name], male    DOB: May 25, 1966, 55 y.o.   MRN: [id number]  Chief Complaint  Patient presents with  . Back Pain  . Testicle Pain    HPI Patient is in today for low back pain and left testicle pain for about a week. The pain has gotten worse for the last few days. The pain is sharp and shooting. It feels like he is having spasms and cramps in his scrotom that also shoots down into his leg.  He has had chills and reports that his urine is a little darker than usual. Does report urgency. Denies dysuria, or frequency. Voiding improves the pain. Denies fever, nausea, or vomiting. He is sexually active. He is married and denies concern for STDs. He has tried ibuprofen with little improvement. He has had kidney stones and had multiple procedures for this. He has a hydrocele on the right testicle. He was seen in the ED about a month ago and had a CT done that shows current non-obstructing kidney stones. He is established with a urologist.   Past Medical History:  Diagnosis Date  . Anxiety   . Bulging discs   . Chronic kidney disease    kidney stones  . Depression   . Hypertension   . Kidney stones     Past Surgical History:  Procedure Laterality Date  . CYSTOSCOPY W/ URETERAL STENT PLACEMENT  09-2011  . CYSTOSCOPY W/ URETERAL STENT REMOVAL  10-2011  . CYSTOSCOPY WITH RETROGRADE PYELOGRAM, URETEROSCOPY AND STENT PLACEMENT Bilateral 03/26/2014   Procedure: CYSTOSCOPY WITH RETROGRADE PYELOGRAM, URETEROSCOPY AND STENT PLACEMENT;  Surgeon: Bernestine Amass, MD;  Location: [facility];  Service: Urology;  Laterality: Bilateral;  . HEMORRHOID SURGERY N/A 09/03/2013   Procedure: HEMORRHOIDECTOMY;  Surgeon: Leighton Ruff, MD;  Location: [facility];  Service: General;  Laterality: N/A;  . HOLMIUM LASER APPLICATION Bilateral 2/50/5397   Procedure: HOLMIUM LASER APPLICATION;  Surgeon: Bernestine Amass, MD;  Location: [facility];  Service: Urology;  Laterality:  Bilateral;  . LITHOTRIPSY    . SHOULDER SURGERY Right 05/2019  . STONE EXTRACTION WITH BASKET Left 03/26/2014   Procedure: STONE EXTRACTION WITH BASKET;  Surgeon: Bernestine Amass, MD;  Location: [facility];  Service: Urology;  Laterality: Left;    Family History  Problem Relation Age of Onset  . Congestive Heart Failure Mother   . ALS Father   . Heart attack Brother 44  . Healthy Son     Social History   Socioeconomic History  . Marital status: Married    Spouse name: Not on file  . Number of children: 1  . Years of education: Not on file  . Highest education level: Not on file  Occupational History  . Occupation: Office manager  Tobacco Use  . Smoking status: Current Every Day Smoker    Packs/day: 1.00    Years: 21.00    Pack years: 21.00    Types: Cigarettes    Start date: 12/31/1992  . Smokeless tobacco: Never Used  Vaping Use  . Vaping Use: Never used  Substance and Sexual Activity  . Alcohol use: Yes    Comment: occasional-1-2 drinks every 2 weeks  . Drug use: No  . Sexual activity: Not Currently  Other Topics Concern  . Not on file  Social History Narrative  . Not on file   Social Determinants of Health   Financial Resource Strain: Not on file  Food  Insecurity: Not on file  Transportation Needs: Not on file  Physical Activity: Not on file  Stress: Not on file  Social Connections: Not on file  Intimate Partner Violence: Not on file    Outpatient Medications Prior to Visit  Medication Sig Dispense Refill  . amLODipine-benazepril (LOTREL) 5-10 MG capsule Take 1 capsule by mouth daily. (NEEDS TO BE SEEN BEFORE NEXT REFILL) 30 capsule 0  . sertraline (ZOLOFT) 100 MG tablet Take 1.5 tablets (150 mg total) by mouth daily. (NEEDS TO BE SEEN BEFORE NEXT REFILL) 45 tablet 0  . tamsulosin (FLOMAX) 0.4 MG CAPS capsule TAKE 1 CAPSULE (0.4 MG TOTAL) BY MOUTH EVERY MORNING. 90 capsule 2   No facility-administered medications prior to visit.    No Known  Allergies  Review of Systems Negative unless specially indicated above in HPI.    Objective:    Physical Exam Vitals and nursing note reviewed.  Constitutional:      Appearance: He is not toxic-appearing or diaphoretic.  HENT:     Head: Normocephalic and atraumatic.  Cardiovascular:     Rate and Rhythm: Normal rate and regular rhythm.     Heart sounds: Normal heart sounds. No murmur heard.   Pulmonary:     Effort: Pulmonary effort is normal. No respiratory distress.     Breath sounds: Normal breath sounds.  Abdominal:     General: Abdomen is flat. Bowel sounds are normal. There is no distension.     Tenderness: There is no abdominal tenderness. There is left CVA tenderness. There is no right CVA tenderness, guarding or rebound.     Hernia: There is no hernia in the left inguinal area.  Genitourinary:    Penis: Normal and circumcised. No tenderness or swelling.      Testes:        Left: Tenderness present. Mass or swelling not present. Left testis is descended. Cremasteric reflex is present.      Epididymis:     Left: Not inflamed or enlarged. Tenderness present. No mass.  Musculoskeletal:     Right lower leg: No edema.     Left lower leg: No edema.  Skin:    General: Skin is warm and dry.  Neurological:     General: No focal deficit present.     Mental Status: He is alert and oriented to person, place, and time.  Psychiatric:        Mood and Affect: Mood normal.        Behavior: Behavior normal.     BP 131/73   Pulse 74   Temp 98.6 F (37 C) (Temporal)   Ht 5\' 11"  (1.803 m)   Wt 164 lb 6 oz (74.6 kg)   BMI 22.93 kg/m  Wt Readings from Last 3 Encounters:  10/26/20 164 lb 6 oz (74.6 kg)  03/18/20 170 lb (77.1 kg)  10/09/18 184 lb (83.5 kg)   Urine dipstick shows positive for RBC's and positive for leukocytes.  Micro exam: 0-2 RBC's per HPF and few+ bacteria.   Health Maintenance Due  Topic Date Due  . Hepatitis C Screening  Never done  . TETANUS/TDAP   Never done  . COLONOSCOPY (Pts 45-70yrs Insurance coverage will need to be confirmed)  04/08/2019  . COVID-19 Vaccine (2 - Moderna 3-dose series) 04/15/2020    There are no preventive care reminders to display for this patient.   Lab Results  Component Value Date   TSH 0.926 06/27/2018   Lab Results  Component Value  Date   WBC 7.4 06/27/2018   HGB 15.1 06/27/2018   HCT 45.5 06/27/2018   MCV 88 06/27/2018   PLT 233 06/27/2018   Lab Results  Component Value Date   NA 141 10/09/2018   K 4.1 10/09/2018   CO2 21 10/09/2018   GLUCOSE 93 10/09/2018   BUN 12 10/09/2018   CREATININE 0.85 10/09/2018   BILITOT 0.5 06/27/2018   ALKPHOS 97 06/27/2018   AST 21 06/27/2018   ALT 17 06/27/2018   PROT 6.2 06/27/2018   ALBUMIN 4.0 06/27/2018   CALCIUM 8.9 10/09/2018   ANIONGAP 14 03/26/2014   Lab Results  Component Value Date   CHOL 156 06/27/2018   Lab Results  Component Value Date   HDL 41 06/27/2018   Lab Results  Component Value Date   LDLCALC 93 06/27/2018   Lab Results  Component Value Date   TRIG 110 06/27/2018   Lab Results  Component Value Date   CHOLHDL 3.8 06/27/2018   Lab Results  Component Value Date   HGBA1C 5.6 03/08/2016       Assessment & Plan:   Willoughby was seen today for back pain and testicle pain.  Diagnoses and all orders for this visit:  Left flank pain Pain in left testicle Unsure of etiology. UA is not compelling for UTI. KUB shows stable nonobstructive kidney stones with one new nonobstructive right stone. Patient declined STD testing.  Will treat empirically with doxycycline, urine culture pending. Flexeril for spasms. Tordal injection today in office. Advil as needed. US scrotum doppler pending. Patient will follow up with urology for new or worsening symptoms or if no improvement.   -     DG Abd 1 View; Future -     Urinalysis, Routine w reflex microscopic -     Urine Culture -     ketorolac (TORADOL) injection 60 mg -      doxycycline (VIBRA-TABS) 100 MG tablet; Take 1 tablet (100 mg total) by mouth 2 (two) times daily for 10 days. 1 po bid -     ibuprofen (ADVIL) 800 MG tablet; Take 1 tablet (800 mg total) by mouth every 8 (eight) hours as needed. -     cyclobenzaprine (FLEXERIL) 10 MG tablet; Take 1 tablet (10 mg total) by mouth 3 (three) times daily as needed for muscle spasms. -     Microscopic Examination -     US SCROTUM DOPPLER; Future  Nephrolithiasis Refill provided.  -     tamsulosin (FLOMAX) 0.4 MG CAPS capsule; Take 1 capsule (0.4 mg total) by mouth daily.  The patient indicates understanding of these issues and agrees with the plan.  Gwenlyn Perking, FNP

## 2020-10-28 ENCOUNTER — Other Ambulatory Visit: Payer: Self-pay

## 2020-10-28 ENCOUNTER — Ambulatory Visit (HOSPITAL_COMMUNITY)
Admission: RE | Admit: 2020-10-28 | Discharge: 2020-10-28 | Disposition: A | Discharge status: Home / Self Care | Payer: BC Managed Care – PPO | Source: Ambulatory Visit | Attending: Family Medicine | Admitting: Family Medicine

## 2020-10-28 DIAGNOSIS — N50812 Left testicular pain: Secondary | ICD-10-CM | POA: Diagnosis not present

## 2020-10-28 DIAGNOSIS — N503 Cyst of epididymis: Secondary | ICD-10-CM | POA: Diagnosis not present

## 2020-10-28 LAB — URINE CULTURE: Organism ID, Bacteria: NO GROWTH

## 2020-12-05 DIAGNOSIS — S61012A Laceration without foreign body of left thumb without damage to nail, initial encounter: Secondary | ICD-10-CM | POA: Diagnosis not present

## 2020-12-05 DIAGNOSIS — Z23 Encounter for immunization: Secondary | ICD-10-CM | POA: Diagnosis not present

## 2020-12-05 DIAGNOSIS — W01198A Fall on same level from slipping, tripping and stumbling with subsequent striking against other object, initial encounter: Secondary | ICD-10-CM | POA: Diagnosis not present

## 2020-12-05 DIAGNOSIS — Y92007 Garden or yard of unspecified non-institutional (private) residence as the place of occurrence of the external cause: Secondary | ICD-10-CM | POA: Diagnosis not present

## 2020-12-23 ENCOUNTER — Telehealth: Payer: Self-pay | Admitting: Family Medicine

## 2020-12-23 DIAGNOSIS — Z6825 Body mass index (BMI) 25.0-25.9, adult: Secondary | ICD-10-CM | POA: Diagnosis not present

## 2020-12-23 DIAGNOSIS — H8309 Labyrinthitis, unspecified ear: Secondary | ICD-10-CM | POA: Diagnosis not present

## 2020-12-23 DIAGNOSIS — R42 Dizziness and giddiness: Secondary | ICD-10-CM | POA: Diagnosis not present

## 2020-12-23 NOTE — Telephone Encounter (Signed)
Spoke with patients wife he went to urgent care for Dizziness and was dx with vertigo. His BP there was 178/108 they did not get a pulse. Patient c/o dizzy HA some blurry vision but he said that is normal for him. Patient is on Lotrel 5-10mg . He had not taken it before appt. Patient stated he had just took it while I was on the phone with him. Advised patient to check it and wife stated their machine was broke. Told pt to make sure take medication like he was suppose to and if symptoms got worse to go the ER. They have no way to check BP at home. Wife states she stays on him to take it everyday. Please advise

## 2020-12-23 NOTE — Telephone Encounter (Signed)
Attempted to call back at 5:08pm and there was no answer.  I agree that if he continues to have accelerated hypertension in the setting of vertigo and headache, I would seek immediate medical care in the ED as you recommended.  I have not seen this patient since 2020.  Please make sure he has IN office follow up for chronic care ASAP

## 2020-12-26 ENCOUNTER — Other Ambulatory Visit: Payer: Self-pay | Admitting: Family Medicine

## 2020-12-26 DIAGNOSIS — F411 Generalized anxiety disorder: Secondary | ICD-10-CM

## 2020-12-26 DIAGNOSIS — I10 Essential (primary) hypertension: Secondary | ICD-10-CM

## 2020-12-26 MED ORDER — AMLODIPINE BESY-BENAZEPRIL HCL 5-10 MG PO CAPS: 1.0000 | ORAL_CAPSULE | Freq: Every day | ORAL | 0 refills | Status: DC

## 2020-12-26 MED ORDER — SERTRALINE HCL 100 MG PO TABS: 150.0000 mg | ORAL_TABLET | Freq: Every day | ORAL | 0 refills | Status: DC

## 2020-12-26 NOTE — Telephone Encounter (Signed)
Patient aware and chronic follow up scheduled

## 2020-12-30 ENCOUNTER — Other Ambulatory Visit: Payer: Self-pay | Admitting: Family Medicine

## 2020-12-30 DIAGNOSIS — I10 Essential (primary) hypertension: Secondary | ICD-10-CM

## 2021-01-30 ENCOUNTER — Other Ambulatory Visit: Payer: Self-pay | Admitting: Family Medicine

## 2021-01-30 DIAGNOSIS — I10 Essential (primary) hypertension: Secondary | ICD-10-CM

## 2021-01-30 DIAGNOSIS — F411 Generalized anxiety disorder: Secondary | ICD-10-CM

## 2021-02-22 ENCOUNTER — Encounter: Payer: Self-pay | Admitting: Family Medicine

## 2021-02-22 ENCOUNTER — Ambulatory Visit: Payer: BC Managed Care – PPO | Admitting: Family Medicine

## 2021-02-22 ENCOUNTER — Other Ambulatory Visit: Payer: Self-pay

## 2021-02-22 VITALS — BP 124/79 | HR 79 | Temp 97.9°F | Ht 71.0 in | Wt 173.8 lb

## 2021-02-22 DIAGNOSIS — Z87442 Personal history of urinary calculi: Secondary | ICD-10-CM

## 2021-02-22 DIAGNOSIS — F411 Generalized anxiety disorder: Secondary | ICD-10-CM | POA: Diagnosis not present

## 2021-02-22 DIAGNOSIS — I10 Essential (primary) hypertension: Secondary | ICD-10-CM

## 2021-02-22 DIAGNOSIS — Z125 Encounter for screening for malignant neoplasm of prostate: Secondary | ICD-10-CM | POA: Diagnosis not present

## 2021-02-22 DIAGNOSIS — Z1211 Encounter for screening for malignant neoplasm of colon: Secondary | ICD-10-CM

## 2021-02-22 DIAGNOSIS — R42 Dizziness and giddiness: Secondary | ICD-10-CM

## 2021-02-22 DIAGNOSIS — G44229 Chronic tension-type headache, not intractable: Secondary | ICD-10-CM

## 2021-02-22 MED ORDER — HYDROXYZINE HCL 50 MG PO TABS: 25.0000 mg | ORAL_TABLET | Freq: Three times a day (TID) | ORAL | 0 refills | Status: DC | PRN

## 2021-02-22 MED ORDER — BUSPIRONE HCL 5 MG PO TABS
ORAL_TABLET | ORAL | 0 refills | Status: AC
Start: 2021-02-22 — End: 2021-03-22

## 2021-02-22 NOTE — Progress Notes (Signed)
Subjective: CC: Multiple concerns PCP: [name], DO [id number] D [name] [name] is a 55 y.o. male presenting to clinic today for:  1.  Generalized anxiety disorder Patient reports exacerbation in his generalized anxiety disorder despite compliance with sertraline 150 mg daily.  He notes that he has had quite a few life changes as of recent.  His sister-in-law unfortunately passed and he has subsequently adopted her 63-year-old grandchild.  This was obviously a very unexpected life event and he is really trying to adjust to these changes.  He has not been able to see his on biologic grandchild in a while and this weighs on him.  He admits to having increased headaches and they seem to be posterior in nature.  Does not report any visual disturbance but does report some dizziness with positional changes.  He has had some issues with sleep as well.  2.  Hypertension Patient is compliant with his Lotrel.  Reports intermittent headaches as above.  No chest pain, shortness of breath.  3.  Renal stones Patient reports frequent renal stone formations.  He is compliant with Flomax daily and reports some orthostatic type symptoms as above.  Urine output seems to be good.  He did have a couple of renal stones in the last month that he passed successfully.  Denies any dysuria, hematuria or pelvic pain today.   ROS: Per HPI  No Known Allergies Past Medical History:  Diagnosis Date   Anxiety    Bulging discs    Chronic kidney disease    kidney stones   Depression    Hypertension    Kidney stones     Current Outpatient Medications:    amLODipine-benazepril (LOTREL) 5-10 MG capsule, TAKE 1 CAPSULE BY MOUTH EVERY DAY, Disp: 30 capsule, Rfl: 1   ibuprofen (ADVIL) 800 MG tablet, Take 1 tablet (800 mg total) by mouth every 8 (eight) hours as needed., Disp: 30 tablet, Rfl: 0   sertraline (ZOLOFT) 100 MG tablet, Take 1.5 tablets (150 mg total) by mouth daily., Disp: 135 tablet, Rfl: 0   tamsulosin  (FLOMAX) 0.4 MG CAPS capsule, Take 1 capsule (0.4 mg total) by mouth daily., Disp: 90 capsule, Rfl: 2 Social History   Socioeconomic History   Marital status: Married    Spouse name: Not on file   Number of children: 1   Years of education: Not on file   Highest education level: Not on file  Occupational History   Occupation: Office manager  Tobacco Use   Smoking status: Every Day    Packs/day: 1.00    Years: 21.00    Pack years: 21.00    Types: Cigarettes    Start date: 12/31/1992   Smokeless tobacco: Never  Vaping Use   Vaping Use: Never used  Substance and Sexual Activity   Alcohol use: Yes    Comment: occasional-1-2 drinks every 2 weeks   Drug use: No   Sexual activity: Not Currently  Other Topics Concern   Not on file  Social History Narrative   Not on file   Social Determinants of Health   Financial Resource Strain: Not on file  Food Insecurity: Not on file  Transportation Needs: Not on file  Physical Activity: Not on file  Stress: Not on file  Social Connections: Not on file  Intimate Partner Violence: Not on file   Family History  Problem Relation Age of Onset   Congestive Heart Failure Mother    ALS Father    Heart attack Brother  23   Healthy Son     Objective: Office vital signs reviewed. BP 124/79   Pulse 79   Temp 97.9 F (36.6 C)   Ht 5' 11" (1.803 m)   Wt 173 lb 12.8 oz (78.8 kg)   SpO2 97%   BMI 24.24 kg/m   Physical Examination:  General: Awake, alert, well nourished, No acute distress HEENT: Normal; sclera white.  No exophthalmos.  PERRLA, EOMI Cardio: regular rate and rhythm, S1S2 heard, no murmurs appreciated Pulm: clear to auscultation bilaterally, no wheezes, rhonchi or rales; normal work of breathing on room air Extremities: warm, well perfused, No edema, cyanosis or clubbing; +2 pulses bilaterally MSK: Normal gait and station Skin: dry; intact; no rashes or lesions Neuro: 5/5 UE and LE Strength and light touch  sensation grossly intact, cranial nerves II through XII are grossly intact.  No nystagmus noted on exam.  Normal upper extremity and lower extremity cerebellar testing Psych: Visibly stressed and anxious appearing.  Eye contact fair.  Thought process is linear.  Does not appear to be responding to internal stimuli Depression screen [facility] 2/9 02/22/2021 10/26/2020 03/18/2020  Decreased Interest 2 0 0  Down, Depressed, Hopeless 2 0 0  PHQ - 2 Score 4 0 0  Altered sleeping 2 - -  Tired, decreased energy 2 - -  Change in appetite 0 - -  Feeling bad or failure about yourself  0 - -  Trouble concentrating 2 - -  Moving slowly or fidgety/restless 1 - -  Suicidal thoughts 0 - -  PHQ-9 Score 11 - -  Difficult doing work/chores - - -   GAD 7 : Generalized Anxiety Score 02/22/2021 03/18/2020 06/27/2018  Nervous, Anxious, on Edge 3 0 1  Control/stop worrying 3 0 1  Worry too much - different things 3 0 2  Trouble relaxing 2 0 1  Restless 2 0 2  Easily annoyed or irritable 3 0 1  Afraid - awful might happen 3 0 0  Total GAD 7 Score 19 0 8  Anxiety Difficulty - Not difficult at all Somewhat difficult      Assessment/ Plan: 55 y.o. male   GAD (generalized anxiety disorder) - Plan: TSH, busPIRone (BUSPAR) 5 MG tablet, hydrOXYzine (ATARAX/VISTARIL) 50 MG tablet  Essential hypertension - Plan: CMP14+EGFR, Lipid panel  History of nephrolithiasis  Screening for malignant neoplasm of prostate - Plan: PSA  Chronic tension-type headache, not intractable  Dizziness  Screen for colon cancer - Plan: Ambulatory referral to Gastroenterology  Anxiety disorder is not well controlled and it does sound like it is situationally exacerbated.  Continue Zoloft 150 mg.  We will add buspirone and have him gradually titrate to lowest therapeutic dose.  I have also given him Vistaril to have as needed breakthrough panic or anxiety.  Discussed the sedating side effect of Vistaril.  We will plan to reconvene in about  4 weeks for full physical or at least for a telephone visit to check up on how anxiety disorder is going.  Additionally, he is working with his wife to obtain a therapist.  I think this would be a great idea for this gentleman but he will certainly reach out to me if he needs assistance getting 1.  Also recommended consideration for massage therapy for both stress release and improvement in neck tension  Blood pressure is well controlled upon recheck.  He will continue current dose of Lotrel.  Plan for fasting labs.  These have been placed  I  do wonder if his Flomax may be causing some orthostasis.  We discussed trial off of this for the next couple weeks to see if perhaps this might improve.  Orthostatic vital signs showed a drop in systolic blood pressure by 11 points when getting up from lying to seated position and minimal drop in diastolic by 4 points with position change.  Discussed adequate hydration.  Could consider use of compression hose if needed.  Neurologic exam was totally normal today and no nystagmus was noted.  Given his chronic tension headache I do think this is likely related to uncontrolled anxiety disorder.  Again he had a totally normal neurologic exam.  However, we discussed that if symptoms persist or he develops any other worrisome symptoms or signs we will have a low threshold to obtain MRI of the brain.  He was in agreement of this plan.  I agree with eye exam to rule out eyestrain.  Referral back to gastroenterology placed for ongoing colon cancer surveillance.  No orders of the defined types were placed in this encounter.  Meds ordered this encounter  Medications   busPIRone (BUSPAR) 5 MG tablet    Sig: Take 1 tablet (5 mg total) by mouth 2 (two) times daily for 7 days, THEN 1.5 tablets (7.5 mg total) 2 (two) times daily for 7 days, THEN 2 tablets (10 mg total) 2 (two) times daily for 14 days.    Dispense:  91 tablet    Refill:  0   hydrOXYzine (ATARAX/VISTARIL) 50 MG  tablet    Sig: Take 0.5-1 tablets (25-50 mg total) by mouth every 8 (eight) hours as needed for anxiety.    Dispense:  30 tablet    Refill:  , DO [place] [phone]

## 2021-02-22 NOTE — Patient Instructions (Signed)
I have placed future orders for fasting labs.  Please come in a earliest convenience to have these done.  I would like to see you back in roughly 4 weeks, telephone visit is fine but at some point we should plan for full physical exam  You will continue your Zoloft.  I have added BuSpar and you will gradually increase this until you find a dose that is helpful.  Hydroxyzine is available to you if needed for breakthrough panic attack and anxiety.  Caution that this can cause sedation and dry mouth  Consider stopping your Flomax for a week or 2 to see if this improves the dizziness.  It sounds like you are having something called orthostatic hypotension which is a transient drop in blood pressure with position change.  Also make sure that you are hydrating adequately with water.  We talked about stress management, consideration for counseling.  I think that your tension headaches have a lot to do with your current stressors.  Massage therapy would be a good idea and I have given you information for Josie Saunders in [place].  See Dr. Marin Comment at [facility] for eye exam.  She has Saturday hours that can accommodate you.  Your neurologic exam was totally normal today.  If you have ongoing symptoms or anything worsening or concerning that occurs, please seek immediate medical attention.  If we do not see improvement in your symptoms we will consider advanced imaging.

## 2021-02-24 ENCOUNTER — Telehealth: Payer: Self-pay

## 2021-02-24 ENCOUNTER — Other Ambulatory Visit: Payer: Self-pay | Admitting: Family Medicine

## 2021-02-24 DIAGNOSIS — I10 Essential (primary) hypertension: Secondary | ICD-10-CM

## 2021-02-24 NOTE — Telephone Encounter (Signed)
CALLED TO SCHEDULE 63MO F/U PER DR. G.

## 2021-02-28 ENCOUNTER — Other Ambulatory Visit: Payer: BC Managed Care – PPO

## 2021-02-28 ENCOUNTER — Other Ambulatory Visit: Payer: Self-pay

## 2021-02-28 DIAGNOSIS — I10 Essential (primary) hypertension: Secondary | ICD-10-CM | POA: Diagnosis not present

## 2021-02-28 DIAGNOSIS — Z125 Encounter for screening for malignant neoplasm of prostate: Secondary | ICD-10-CM | POA: Diagnosis not present

## 2021-02-28 DIAGNOSIS — F411 Generalized anxiety disorder: Secondary | ICD-10-CM

## 2021-03-01 LAB — CMP14+EGFR
ALT: 14 IU/L (ref 0–44)
AST: 15 IU/L (ref 0–40)
Albumin/Globulin Ratio: 1.9 (ref 1.2–2.2)
Albumin: 4.3 g/dL (ref 3.8–4.9)
Alkaline Phosphatase: 115 IU/L (ref 44–121)
BUN/Creatinine Ratio: 16 (ref 9–20)
BUN: 11 mg/dL (ref 6–24)
Bilirubin Total: 0.4 mg/dL (ref 0.0–1.2)
CO2: 23 mmol/L (ref 20–29)
Calcium: 8.8 mg/dL (ref 8.7–10.2)
Chloride: 103 mmol/L (ref 96–106)
Creatinine, Ser: 0.7 mg/dL — ABNORMAL LOW (ref 0.76–1.27)
Globulin, Total: 2.3 g/dL (ref 1.5–4.5)
Glucose: 96 mg/dL (ref 65–99)
Potassium: 4.5 mmol/L (ref 3.5–5.2)
Sodium: 140 mmol/L (ref 134–144)
Total Protein: 6.6 g/dL (ref 6.0–8.5)
eGFR: 108 mL/min/{1.73_m2} (ref >59)

## 2021-03-01 LAB — PSA: Prostate Specific Ag, Serum: 2.8 ng/mL (ref 0.0–4.0)

## 2021-03-01 LAB — LIPID PANEL
Chol/HDL Ratio: 3.7 ratio (ref 0.0–5.0)
Cholesterol, Total: 186 mg/dL (ref 100–199)
HDL: 50 mg/dL (ref >39)
LDL Chol Calc (NIH): 115 mg/dL — ABNORMAL HIGH (ref 0–99)
Triglycerides: 115 mg/dL (ref 0–149)
VLDL Cholesterol Cal: 21 mg/dL (ref 5–40)

## 2021-03-01 LAB — TSH: TSH: 2.42 u[IU]/mL (ref 0.450–4.500)

## 2021-03-01 MED ORDER — ROSUVASTATIN CALCIUM 10 MG PO TABS: 10.0000 mg | ORAL_TABLET | Freq: Every day | ORAL | 3 refills | Status: DC

## 2021-03-14 ENCOUNTER — Other Ambulatory Visit: Payer: Self-pay | Admitting: Family Medicine

## 2021-03-14 DIAGNOSIS — F411 Generalized anxiety disorder: Secondary | ICD-10-CM

## 2021-03-15 ENCOUNTER — Telehealth: Payer: Self-pay | Admitting: Family Medicine

## 2021-03-23 ENCOUNTER — Telehealth: Payer: Self-pay | Admitting: Family Medicine

## 2021-03-23 ENCOUNTER — Other Ambulatory Visit: Payer: Self-pay | Admitting: Family Medicine

## 2021-03-23 DIAGNOSIS — F411 Generalized anxiety disorder: Secondary | ICD-10-CM

## 2021-03-23 MED ORDER — BUSPIRONE HCL 10 MG PO TABS
10.0000 mg | ORAL_TABLET | Freq: Two times a day (BID) | ORAL | 1 refills | Status: DC
Start: 2021-03-23 — End: 2021-04-18

## 2021-03-23 MED ORDER — HYDROXYZINE HCL 50 MG PO TABS: 25.0000 mg | ORAL_TABLET | Freq: Three times a day (TID) | ORAL | 0 refills | Status: DC | PRN

## 2021-03-23 NOTE — Telephone Encounter (Signed)
Patient wife aware

## 2021-03-23 NOTE — Telephone Encounter (Signed)
BuSpar has been sent as 10 mg twice daily.  This was the dose that his we are trying to get him to.  Please inform that he will be taking only 1 tablet twice a day since this is the new strength Hydroxyzine renewed as well

## 2021-03-23 NOTE — Telephone Encounter (Signed)
Patient is requesting a refill on the buspar and hydroxyzine. Please advise

## 2021-04-18 ENCOUNTER — Telehealth (INDEPENDENT_AMBULATORY_CARE_PROVIDER_SITE_OTHER): Payer: BC Managed Care – PPO | Admitting: Family Medicine

## 2021-04-18 DIAGNOSIS — F5104 Psychophysiologic insomnia: Secondary | ICD-10-CM

## 2021-04-18 DIAGNOSIS — E7800 Pure hypercholesterolemia, unspecified: Secondary | ICD-10-CM | POA: Diagnosis not present

## 2021-04-18 DIAGNOSIS — F411 Generalized anxiety disorder: Secondary | ICD-10-CM

## 2021-04-18 DIAGNOSIS — G44209 Tension-type headache, unspecified, not intractable: Secondary | ICD-10-CM

## 2021-04-18 MED ORDER — BUSPIRONE HCL 10 MG PO TABS: 10.0000 mg | ORAL_TABLET | Freq: Two times a day (BID) | ORAL | 3 refills | Status: DC

## 2021-04-18 MED ORDER — BUSPIRONE HCL 10 MG PO TABS: 15.0000 mg | ORAL_TABLET | Freq: Two times a day (BID) | ORAL | 3 refills | Status: DC

## 2021-04-18 MED ORDER — SERTRALINE HCL 100 MG PO TABS: 150.0000 mg | ORAL_TABLET | Freq: Every day | ORAL | 0 refills | Status: DC

## 2021-04-18 MED ORDER — HYDROXYZINE HCL 50 MG PO TABS: 75.0000 mg | ORAL_TABLET | Freq: Every evening | ORAL | 3 refills | Status: DC | PRN

## 2021-04-18 NOTE — Progress Notes (Signed)
MyChart Video visit  Subjective: CC: Follow-up anxiety PCP: [name], DO TKZ:[id number] [name] [name] is a 55 y.o. male. Patient provides verbal consent for consult held via video.  Due to COVID-19 pandemic this visit was conducted virtually. This visit type was conducted due to national recommendations for restrictions regarding the COVID-19 Pandemic (e.g. social distancing, sheltering in place) in an effort to limit this patient's exposure and mitigate transmission in our community. All issues noted in this document were discussed and addressed.  A physical exam was not performed with this format.   Location of patient: work Research scientist (medical) of provider: [facility] Others present for call: none  1.  Anxiety disorder, insomnia Patient reports that anxiety disorder does seem to be getting somewhat better.  He feels like the BuSpar 10 mg twice daily is helpful and does not wish to advance dose at this time.  He is compliant with Zoloft 150 mg daily.  The adopted child is currently in counseling and this seems to be helping things within the household.  He has been using the hydroxyzine 50 mg at bedtime and this has helped his sleep very slightly but he continues to have difficulty both falling asleep and staying asleep.  He continues to have frequent headaches though less than before.  He does feel that the new eyeglasses have helped with the chronic daily headaches.  Denies any neurologic changes.  2.  Hyperlipidemia Patient is compliant with the Crestor daily.  He denies any myalgia.  No chest pain, abdominal pain or skin changes noted   ROS: Per HPI  No Known Allergies Past Medical History:  Diagnosis Date   Anxiety    Bulging discs    Chronic kidney disease    kidney stones   Depression    Hypertension    Kidney stones     Current Outpatient Medications:    rosuvastatin (CRESTOR) 10 MG tablet, Take 1 tablet (10 mg total) by mouth daily., Disp: 90 tablet, Rfl: 3    amLODipine-benazepril (LOTREL) 5-10 MG capsule, TAKE 1 CAPSULE BY MOUTH EVERY DAY, Disp: 90 capsule, Rfl: 1   busPIRone (BUSPAR) 10 MG tablet, Take 1 tablet (10 mg total) by mouth 2 (two) times daily. NOTE change in dose, Disp: 60 tablet, Rfl: 1   hydrOXYzine (ATARAX/VISTARIL) 50 MG tablet, Take 0.5-1 tablets (25-50 mg total) by mouth every 8 (eight) hours as needed for anxiety., Disp: 30 tablet, Rfl: 0   ibuprofen (ADVIL) 800 MG tablet, Take 1 tablet (800 mg total) by mouth every 8 (eight) hours as needed., Disp: 30 tablet, Rfl: 0   sertraline (ZOLOFT) 100 MG tablet, Take 1.5 tablets (150 mg total) by mouth daily., Disp: 135 tablet, Rfl: 0   tamsulosin (FLOMAX) 0.4 MG CAPS capsule, Take 1 capsule (0.4 mg total) by mouth daily., Disp: 90 capsule, Rfl: 2  Gen: Alert, awake, nontoxic-appearing male Psych: Mood stable, speech normal.  Thought process linear.  Good eye contact.  Assessment/ Plan: 55 y.o. male   GAD (generalized anxiety disorder) - Plan: hydrOXYzine (ATARAX/VISTARIL) 50 MG tablet, sertraline (ZOLOFT) 100 MG tablet, busPIRone (BUSPAR) 10 MG tablet, DISCONTINUED: busPIRone (BUSPAR) 10 MG tablet  Psychophysiological insomnia  Tension headache  Pure hypercholesterolemia - Plan: Lipid panel, CMP14+EGFR  Doing better from an anxiety standpoint but continues to have some stressors that are interfering with sleep.  We will continue current dose of Zoloft, buspirone.  I have advanced his dose of hydroxyzine in efforts to promote better sleep.  We discussed use of  melatonin up to 10 mg maximum for sleep as well.  His tension headaches do seem to be getting better but are still present intermittently.  Again I think a lot of this has to do with lack of appropriate sleep.  We discussed encouraged p.o. hydration, methods as above to promote adequate sleep.  If continues to have issues, could consider sleep study versus pursuing imaging of the brain though his previous examination did not  demonstrate any neurologic abnormalities.  He will come in for repeat fasting lipid, liver function tests.  Tolerating Crestor without difficulty.  We will see each other again in December for annual physical exam.  He is aware of date and time.   Start time: 11:03am End time: 11:13am  Total time spent on patient care (including video visit/ documentation): 10 minutes  [place], [place] [phone]

## 2021-04-23 DIAGNOSIS — Z6824 Body mass index (BMI) 24.0-24.9, adult: Secondary | ICD-10-CM | POA: Diagnosis not present

## 2021-04-23 DIAGNOSIS — S0502XA Injury of conjunctiva and corneal abrasion without foreign body, left eye, initial encounter: Secondary | ICD-10-CM | POA: Diagnosis not present

## 2021-05-09 DIAGNOSIS — H10013 Acute follicular conjunctivitis, bilateral: Secondary | ICD-10-CM | POA: Diagnosis not present

## 2021-05-12 DIAGNOSIS — H04123 Dry eye syndrome of bilateral lacrimal glands: Secondary | ICD-10-CM | POA: Diagnosis not present

## 2021-05-15 ENCOUNTER — Other Ambulatory Visit: Payer: Self-pay | Admitting: Family Medicine

## 2021-05-15 DIAGNOSIS — F411 Generalized anxiety disorder: Secondary | ICD-10-CM

## 2021-05-24 ENCOUNTER — Other Ambulatory Visit: Payer: Self-pay

## 2021-05-24 ENCOUNTER — Encounter: Payer: Self-pay | Admitting: Family Medicine

## 2021-05-24 ENCOUNTER — Ambulatory Visit: Payer: BC Managed Care – PPO | Admitting: Family Medicine

## 2021-05-24 VITALS — BP 164/102 | HR 60 | Temp 97.7°F | Ht 71.0 in | Wt 180.4 lb

## 2021-05-24 DIAGNOSIS — Z23 Encounter for immunization: Secondary | ICD-10-CM

## 2021-05-24 DIAGNOSIS — F17200 Nicotine dependence, unspecified, uncomplicated: Secondary | ICD-10-CM

## 2021-05-24 DIAGNOSIS — R062 Wheezing: Secondary | ICD-10-CM

## 2021-05-24 DIAGNOSIS — F411 Generalized anxiety disorder: Secondary | ICD-10-CM | POA: Diagnosis not present

## 2021-05-24 MED ORDER — BUSPIRONE HCL 15 MG PO TABS: 15.0000 mg | ORAL_TABLET | Freq: Two times a day (BID) | ORAL | 0 refills | Status: DC

## 2021-05-24 NOTE — Progress Notes (Signed)
Subjective: CC: Follow-up anxiety disorder PCP: [name], DO [id number] D Laser is a 55 y.o. [name] [name] presenting to clinic today for:  1.  Generalized anxiety disorder Patient endorses ongoing anxiety but notes that this is getting slightly better with medication.  He reports that [name] [name] [name] was diagnosed with ODD.  He notes that he has his good days and his bad days.  On the bad days Mr. Gamblin admits that he becomes easily frustrated but really tries to be understanding.  He voices feelings of guilt when he becomes frustrated because he understands issues are beyond the child's control.  He feels his sleep is slightly better but his wife notes that he seems to talk in move a bit during his sleep.  2.  Tobacco use Patient is an active smoker.  He was previously successful quitting smoking with Chantix.  He is interested in potentially resuming this but feels that he "is taking a pharmacy every day" and is not quite ready to start taking more medication yet.  He has an appointment with me in a month for annual physical and wants to revisit that subject at that time.  Denies any shortness of breath, wheezing.  No hemoptysis.   ROS: Per HPI  No Known Allergies Past Medical History:  Diagnosis Date   Anxiety    Bulging discs    Chronic kidney disease    kidney stones   Depression    Hypertension    Kidney stones     Current Outpatient Medications:    amLODipine-benazepril (LOTREL) 5-10 MG capsule, TAKE 1 CAPSULE BY MOUTH EVERY DAY, Disp: 90 capsule, Rfl: 1   busPIRone (BUSPAR) 10 MG tablet, Take 1 tablet (10 mg total) by mouth 2 (two) times daily. PLEASE DISMISS 15mg . SHOULD BE 10mg  BID., Disp: 180 tablet, Rfl: 3   hydrOXYzine (ATARAX/VISTARIL) 50 MG tablet, TAKE 1.5-2 TABLETS ([name]-100 MG TOTAL) BY MOUTH AT BEDTIME AS NEEDED FOR ANXIETY (SLEEP)., Disp: 180 tablet, Rfl: 0   ibuprofen (ADVIL) 800 MG tablet, Take 1 tablet (800 mg total) by mouth every 8 (eight) hours as needed.,  Disp: 30 tablet, Rfl: 0   rosuvastatin (CRESTOR) 10 MG tablet, Take 1 tablet (10 mg total) by mouth daily., Disp: 90 tablet, Rfl: 3   sertraline (ZOLOFT) 100 MG tablet, Take 1.5 tablets (150 mg total) by mouth daily., Disp: 135 tablet, Rfl: 0 Social History   Socioeconomic History   Marital status: Married    Spouse name: Not on file   Number of children: 1   Years of education: Not on file   Highest education level: Not on file  Occupational History   Occupation: Office manager  Tobacco Use   Smoking status: Every Day    Packs/day: 1.00    Years: 21.00    Pack years: 21.00    Types: Cigarettes    Start date: 12/31/1992   Smokeless tobacco: Never  Vaping Use   Vaping Use: Never used  Substance and Sexual Activity   Alcohol use: Yes    Comment: occasional-1-2 drinks every 2 weeks   Drug use: No   Sexual activity: Not Currently  Other Topics Concern   Not on file  Social History Narrative   Not on file   Social Determinants of Health   Financial Resource Strain: Not on file  Food Insecurity: Not on file  Transportation Needs: Not on file  Physical Activity: Not on file  Stress: Not on file  Social Connections: Not on file  Intimate  Partner Violence: Not on file   Family History  Problem Relation Age of Onset   Congestive Heart Failure Mother    ALS Father    Heart attack Brother 46   Healthy Son     Objective: Office vital signs reviewed. BP (!) 164/102   Pulse 60   Temp 97.7 F (36.5 C)   Ht 5\' 11"  (1.803 m)   Wt 180 lb 6.4 oz (81.8 kg)   SpO2 96%   BMI 25.16 kg/m   Physical Examination:  General: Awake, alert, well nourished, No acute distress Cardio: regular rate and rhythm, S1S2 heard, no murmurs appreciated Pulm: Global expiratory wheezes.  Normal work of breathing on room air. Psych: Mood is somewhat anxious.  Patient is pleasant, interactive.  He has a very linear thought process.  Depression screen [name] 2/9 05/24/2021 02/22/2021  10/26/2020  Decreased Interest 0 2 0  Down, Depressed, Hopeless 2 2 0  PHQ - 2 Score 2 4 0  Altered sleeping 2 2 -  Tired, decreased energy 1 2 -  Change in appetite 0 0 -  Feeling bad or failure about yourself  0 0 -  Trouble concentrating 2 2 -  Moving slowly or fidgety/restless 2 1 -  Suicidal thoughts 0 0 -  PHQ-9 Score 9 11 -  Difficult doing work/chores Somewhat difficult - -   GAD 7 : Generalized Anxiety Score 05/24/2021 02/22/2021 03/18/2020 06/27/2018  Nervous, Anxious, on Edge 2 3 0 1  Control/stop worrying 3 3 0 1  Worry too much - different things 2 3 0 2  Trouble relaxing 3 2 0 1  Restless 2 2 0 2  Easily annoyed or irritable 2 3 0 1  Afraid - awful might happen 2 3 0 0  Total GAD 7 Score 16 19 0 8  Anxiety Difficulty Somewhat difficult - Not difficult at all Somewhat difficult     Assessment/ Plan: 55 y.o. [name] [name]   GAD (generalized anxiety disorder) - Plan: busPIRone (BUSPAR) 15 MG tablet  Need for immunization against influenza - Plan: Flu Vaccine QUAD 1mo+IM (Fluarix, Fluzone & Alfiuria Quad PF)  Smoker  Wheezes  Anxiety disorder is not well controlled but getting slightly better.  We are going to advance the BuSpar to 15 mg twice daily.  Continue Zoloft at current dose.  We will reassess at his annual physical in about a month  Influenza vaccination administered.  Actively smoking and in the contemplative stage of discontinuation.  Wants to pursue Chantix again as he was successful with that.  His lung exam did demonstrate global expiratory wheezes and I wonder if he is starting to develop evidence of COPD.  He did not endorse for symptoms and therefore no inhalers were initiated  Orders Placed This Encounter  Procedures   Flu Vaccine QUAD 59mo+IM (Fluarix, Fluzone & Alfiuria Quad PF)   No orders of the defined types were placed in this encounter.    [name], DO [name] [name] [name] [phone]

## 2021-06-09 ENCOUNTER — Encounter: Payer: Self-pay | Admitting: Gastroenterology

## 2021-06-19 ENCOUNTER — Ambulatory Visit (INDEPENDENT_AMBULATORY_CARE_PROVIDER_SITE_OTHER): Payer: BC Managed Care – PPO | Admitting: Family Medicine

## 2021-06-19 ENCOUNTER — Other Ambulatory Visit: Payer: Self-pay | Admitting: Family Medicine

## 2021-06-19 ENCOUNTER — Encounter: Payer: Self-pay | Admitting: Family Medicine

## 2021-06-19 VITALS — BP 158/101 | HR 69 | Temp 98.0°F | Ht 71.0 in | Wt 179.2 lb

## 2021-06-19 DIAGNOSIS — Z720 Tobacco use: Secondary | ICD-10-CM

## 2021-06-19 DIAGNOSIS — I10 Essential (primary) hypertension: Secondary | ICD-10-CM

## 2021-06-19 DIAGNOSIS — Z87442 Personal history of urinary calculi: Secondary | ICD-10-CM | POA: Diagnosis not present

## 2021-06-19 DIAGNOSIS — Z0000 Encounter for general adult medical examination without abnormal findings: Secondary | ICD-10-CM

## 2021-06-19 DIAGNOSIS — M7989 Other specified soft tissue disorders: Secondary | ICD-10-CM | POA: Diagnosis not present

## 2021-06-19 DIAGNOSIS — E7800 Pure hypercholesterolemia, unspecified: Secondary | ICD-10-CM

## 2021-06-19 DIAGNOSIS — Z0001 Encounter for general adult medical examination with abnormal findings: Secondary | ICD-10-CM | POA: Diagnosis not present

## 2021-06-19 DIAGNOSIS — F411 Generalized anxiety disorder: Secondary | ICD-10-CM

## 2021-06-19 MED ORDER — VARENICLINE TARTRATE 0.5 MG X 11 & 1 MG X 42 PO TBPK: ORAL_TABLET | ORAL | 0 refills | Status: DC

## 2021-06-19 MED ORDER — TAMSULOSIN HCL 0.4 MG PO CAPS
0.4000 mg | ORAL_CAPSULE | Freq: Every day | ORAL | 3 refills | Status: DC
Start: 2021-06-19 — End: 2022-09-13

## 2021-06-19 MED ORDER — VARENICLINE TARTRATE 1 MG PO TABS: 1.0000 mg | ORAL_TABLET | Freq: Two times a day (BID) | ORAL | 2 refills | Status: DC

## 2021-06-19 NOTE — Progress Notes (Signed)
[name] [name] is a 55 y.o. male presents to office today for annual physical exam examination.    Concerns today include: 1.  Headaches Patient reports that he has been having some intermittent headaches.  He continues to smoke as well but is interested in pursuing the Chantix for smoking cessation and he successfully gotten some reduction in cigarettes on his own.  2.  Left leg swelling Patient reports he had abrupt left leg swelling from the calf down to the left ankle.  This was of uncertain onset.  He does not recall injuring it but the swelling has gotten much better over the last couple of days.  He was experiencing pain in the left leg.  Denies any recent travel, change in medications.  No known family history of clotting disorders.  He smokes as above  3.  Kidney stones Patient would like to get back on Flomax.  Has been having more kidney stone production and has really struggled the past 1 recently.  4.  Hypertension Patient is compliant with his blood pressure medication.  No chest pain, shortness of breath, dizziness, headache.  He reports left leg swelling as above  Substance use: Tobacco Diet: Fair, Exercise: No structured reported Last colonoscopy: Scheduled for February Refills needed today: Flomax Immunizations needed: Immunization History  Administered Date(s) Administered   Influenza,inj,Quad PF,6+ Mos 05/24/2021   Moderna Sars-Covid-2 Vaccination 03/18/2020     Past Medical History:  Diagnosis Date   Anxiety    Bulging discs    Chronic kidney disease    kidney stones   Depression    Hypertension    Kidney stones    Social History   Socioeconomic History   Marital status: Married    Spouse name: Not on file   Number of children: 1   Years of education: Not on file   Highest education level: Not on file  Occupational History   Occupation: Office manager  Tobacco Use   Smoking status: Every Day    Packs/day: 1.00    Years: 21.00     Pack years: 21.00    Types: Cigarettes    Start date: 12/31/1992   Smokeless tobacco: Never  Vaping Use   Vaping Use: Never used  Substance and Sexual Activity   Alcohol use: Yes    Comment: occasional-1-2 drinks every 2 weeks   Drug use: No   Sexual activity: Not Currently  Other Topics Concern   Not on file  Social History Narrative   Not on file   Social Determinants of Health   Financial Resource Strain: Not on file  Food Insecurity: Not on file  Transportation Needs: Not on file  Physical Activity: Not on file  Stress: Not on file  Social Connections: Not on file  Intimate Partner Violence: Not on file   Past Surgical History:  Procedure Laterality Date   CYSTOSCOPY W/ URETERAL STENT PLACEMENT  09-2011   CYSTOSCOPY W/ URETERAL STENT REMOVAL  10-2011   CYSTOSCOPY WITH RETROGRADE PYELOGRAM, URETEROSCOPY AND STENT PLACEMENT Bilateral 03/26/2014   Procedure: CYSTOSCOPY WITH RETROGRADE PYELOGRAM, URETEROSCOPY AND STENT PLACEMENT;  Surgeon: Bernestine Amass, MD;  Location: [facility];  Service: Urology;  Laterality: Bilateral;   HEMORRHOID SURGERY N/A 09/03/2013   Procedure: HEMORRHOIDECTOMY;  Surgeon: Leighton Ruff, MD;  Location: [facility];  Service: General;  Laterality: N/A;   HOLMIUM LASER APPLICATION Bilateral 3/34/3568   Procedure: HOLMIUM LASER APPLICATION;  Surgeon: Bernestine Amass, MD;  Location: [facility];  Service: Urology;  Laterality: Bilateral;   LITHOTRIPSY     SHOULDER SURGERY Right 05/2019   STONE EXTRACTION WITH BASKET Left 03/26/2014   Procedure: STONE EXTRACTION WITH BASKET;  Surgeon: Bernestine Amass, MD;  Location: [facility];  Service: Urology;  Laterality: Left;   Family History  Problem Relation Age of Onset   Congestive Heart Failure Mother    ALS Father    Heart attack Brother 58   Healthy Son     Current Outpatient Medications:    amLODipine-benazepril (LOTREL) 5-10 MG capsule, TAKE 1 CAPSULE BY MOUTH EVERY DAY, Disp: 90 capsule, Rfl: 1   busPIRone (BUSPAR) 15 MG  tablet, Take 1 tablet (15 mg total) by mouth 2 (two) times daily., Disp: 60 tablet, Rfl: 0   hydrOXYzine (ATARAX/VISTARIL) 50 MG tablet, TAKE 1.5-2 TABLETS (75-100 MG TOTAL) BY MOUTH AT BEDTIME AS NEEDED FOR ANXIETY (SLEEP)., Disp: 180 tablet, Rfl: 0   rosuvastatin (CRESTOR) 10 MG tablet, Take 1 tablet (10 mg total) by mouth daily., Disp: 90 tablet, Rfl: 3   sertraline (ZOLOFT) 100 MG tablet, Take 1.5 tablets (150 mg total) by mouth daily., Disp: 135 tablet, Rfl: 0   ibuprofen (ADVIL) 800 MG tablet, Take 1 tablet (800 mg total) by mouth every 8 (eight) hours as needed. (Patient not taking: Reported on 06/19/2021), Disp: 30 tablet, Rfl: 0  No Known Allergies   ROS: Review of Systems Pertinent items noted in HPI and remainder of comprehensive ROS otherwise negative.    Physical exam BP (!) 158/101   Pulse 69   Temp 98 F (36.7 C)   Ht '5\' 11"'  (1.803 m)   Wt 179 lb 3.2 oz (81.3 kg)   SpO2 95%   BMI 24.99 kg/m  General appearance: alert, cooperative, appears stated age, and no distress Head: Normocephalic, without obvious abnormality, atraumatic Eyes: negative findings: lids and lashes normal, conjunctivae and sclerae normal, corneas clear, and pupils equal, round, reactive to light and accomodation Ears: normal TM's and external ear canals both ears Nose: Nares normal. Septum midline. Mucosa normal. No drainage or sinus tenderness. Throat: lips, mucosa, and tongue normal; teeth and gums normal Neck: no adenopathy, no carotid bruit, supple, symmetrical, trachea midline, and thyroid not enlarged, symmetric, no tenderness/mass/nodules Back: symmetric, no curvature. ROM normal. No CVA tenderness. Lungs: clear to auscultation bilaterally Chest wall: no tenderness Heart: regular rate and rhythm, S1, S2 normal, no murmur, click, rub or gallop Abdomen: soft, non-tender; bowel sounds normal; no masses,  no organomegaly Extremities: extremities normal, atraumatic, no cyanosis. Trace LLE edema.   No tenderness palpation to the calf.  No significant left calf swelling.  No palpable cords.  Negative Homan sign Pulses: 2+ and symmetric Skin: Skin color, texture, turgor normal. No rashes or lesions Lymph nodes: Cervical, supraclavicular, and axillary nodes normal. Neurologic: Alert and oriented X 3, normal strength and tone. Normal symmetric reflexes. Normal coordination and gait Psych: Mood stable, speech normal, affect appropriate  Assessment/ Plan: Malachy Moan here for annual physical exam.   Annual physical exam  Tobacco use - Plan: varenicline (CHANTIX CONTINUING MONTH PAK) 1 MG tablet, varenicline (CHANTIX PAK) 0.5 MG X 11 & 1 MG X 42 tablet  Essential hypertension  Pure hypercholesterolemia - Plan: CMP14+EGFR, Lipid panel  History of nephrolithiasis - Plan: tamsulosin (FLOMAX) 0.4 MG CAPS capsule  Left leg swelling - Plan: D-dimer, quantitative  He will have colonoscopy done soon.  He is in the action phase of smoking cessation Chantix has been sent  Blood pressure was  not at goal.  He will continue his blood pressure medication and follow-up in the next 2 weeks for blood pressure recheck.  If persistently elevated I favor increasing his Lotrel to 5-20 mg.  He will have fasting lipid and CMP obtained.  Continue statin  Flomax renewed for nephrolithiasis  Concern for possible DVT given abrupt onset of left leg swelling.  D-dimer obtained to rule out DVT and this was negative.  Counseled on healthy lifestyle choices, including diet (rich in fruits, vegetables and lean meats and low in salt and simple carbohydrates) and exercise (at least 30 minutes of moderate physical activity daily).  Patient to follow up in 1 year for annual exam or sooner if needed.  Skie Vitrano M. Lajuana Ripple, DO

## 2021-06-20 LAB — CMP14+EGFR
ALT: 19 IU/L (ref 0–44)
AST: 18 IU/L (ref 0–40)
Albumin/Globulin Ratio: 1.8 (ref 1.2–2.2)
Albumin: 4.3 g/dL (ref 3.8–4.9)
Alkaline Phosphatase: 117 IU/L (ref 44–121)
BUN/Creatinine Ratio: 15 (ref 9–20)
BUN: 13 mg/dL (ref 6–24)
Bilirubin Total: 0.3 mg/dL (ref 0.0–1.2)
CO2: 23 mmol/L (ref 20–29)
Calcium: 8.8 mg/dL (ref 8.7–10.2)
Chloride: 102 mmol/L (ref 96–106)
Creatinine, Ser: 0.87 mg/dL (ref 0.76–1.27)
Globulin, Total: 2.4 g/dL (ref 1.5–4.5)
Glucose: 83 mg/dL (ref 70–99)
Potassium: 4.4 mmol/L (ref 3.5–5.2)
Sodium: 139 mmol/L (ref 134–144)
Total Protein: 6.7 g/dL (ref 6.0–8.5)
eGFR: 101 mL/min/{1.73_m2} (ref >59)

## 2021-06-20 LAB — LIPID PANEL
Chol/HDL Ratio: 3.3 ratio (ref 0.0–5.0)
Cholesterol, Total: 154 mg/dL (ref 100–199)
HDL: 46 mg/dL (ref >39)
LDL Chol Calc (NIH): 86 mg/dL (ref 0–99)
Triglycerides: 124 mg/dL (ref 0–149)
VLDL Cholesterol Cal: 22 mg/dL (ref 5–40)

## 2021-06-20 LAB — D-DIMER, QUANTITATIVE: D-DIMER: 0.27 mg/L FEU (ref 0.00–0.49)

## 2021-07-05 ENCOUNTER — Ambulatory Visit: Payer: BC Managed Care – PPO

## 2021-07-05 ENCOUNTER — Other Ambulatory Visit: Payer: Self-pay | Admitting: Family Medicine

## 2021-07-05 VITALS — BP 143/86 | HR 66

## 2021-07-05 DIAGNOSIS — I10 Essential (primary) hypertension: Secondary | ICD-10-CM

## 2021-07-05 DIAGNOSIS — Z0130 Encounter for examination of blood pressure without abnormal findings: Secondary | ICD-10-CM

## 2021-07-05 MED ORDER — AMLODIPINE BESY-BENAZEPRIL HCL 5-20 MG PO CAPS: 1.0000 | ORAL_CAPSULE | Freq: Every day | ORAL | 3 refills | Status: DC

## 2021-07-05 NOTE — Progress Notes (Signed)
Patient here today to have blood pressure checked.  Blood pressure is 143/86, pulse 66.

## 2021-07-19 ENCOUNTER — Other Ambulatory Visit: Payer: Self-pay | Admitting: Family Medicine

## 2021-07-19 DIAGNOSIS — F411 Generalized anxiety disorder: Secondary | ICD-10-CM

## 2021-07-21 ENCOUNTER — Encounter: Payer: Self-pay | Admitting: Family Medicine

## 2021-08-10 ENCOUNTER — Other Ambulatory Visit: Payer: Self-pay

## 2021-08-10 ENCOUNTER — Ambulatory Visit (AMBULATORY_SURGERY_CENTER): Payer: BC Managed Care – PPO | Admitting: *Deleted

## 2021-08-10 VITALS — Ht 71.0 in | Wt 180.0 lb

## 2021-08-10 DIAGNOSIS — Z86010 Personal history of colonic polyps: Secondary | ICD-10-CM

## 2021-08-10 MED ORDER — PEG 3350-KCL-NA BICARB-NACL 420 G PO SOLR: 4000.0000 mL | Freq: Once | ORAL | 0 refills | Status: AC

## 2021-08-10 NOTE — Progress Notes (Signed)

## 2021-08-15 ENCOUNTER — Other Ambulatory Visit: Payer: Self-pay | Admitting: Family Medicine

## 2021-08-15 DIAGNOSIS — F411 Generalized anxiety disorder: Secondary | ICD-10-CM

## 2021-08-15 NOTE — Telephone Encounter (Signed)
Last office visit 06/19/21 annual exam Last refill 05/15/21, #180, no refills

## 2021-08-24 ENCOUNTER — Encounter: Payer: Self-pay | Admitting: Gastroenterology

## 2021-08-24 ENCOUNTER — Ambulatory Visit (AMBULATORY_SURGERY_CENTER): Payer: BC Managed Care – PPO | Admitting: Gastroenterology

## 2021-08-24 ENCOUNTER — Other Ambulatory Visit: Payer: Self-pay

## 2021-08-24 VITALS — BP 119/84 | HR 52 | Temp 98.7°F | Resp 13 | Ht 71.0 in | Wt 180.0 lb

## 2021-08-24 DIAGNOSIS — D124 Benign neoplasm of descending colon: Secondary | ICD-10-CM | POA: Diagnosis not present

## 2021-08-24 DIAGNOSIS — Z86010 Personal history of colonic polyps: Secondary | ICD-10-CM | POA: Diagnosis not present

## 2021-08-24 DIAGNOSIS — Z1211 Encounter for screening for malignant neoplasm of colon: Secondary | ICD-10-CM | POA: Diagnosis not present

## 2021-08-24 MED ORDER — SODIUM CHLORIDE 0.9 % IV SOLN: 500.0000 mL | Freq: Once | INTRAVENOUS | Status: DC

## 2021-08-24 NOTE — Progress Notes (Signed)
History & Physical  Primary Care Physician:  [name], DO Primary Gastroenterologist: Lucio Edward, MD  CHIEF COMPLAINT:  Personal history of colon polyps   HPI: [name] [name] is a 56 y.o. male with a history of adenomatous colon polyp on colonoscopy in 2010 here for surveillance colonoscopy.    Past Medical History:  Diagnosis Date   Anxiety    Bulging discs    Chronic kidney disease    kidney stones   Depression    Hyperlipidemia    Hypertension    Irregular heart beat    25 years ago   Kidney stones     Past Surgical History:  Procedure Laterality Date   COLONOSCOPY     CYSTOSCOPY W/ URETERAL STENT PLACEMENT  09/2011   CYSTOSCOPY W/ URETERAL STENT REMOVAL  10/2011   CYSTOSCOPY WITH RETROGRADE PYELOGRAM, URETEROSCOPY AND STENT PLACEMENT Bilateral 03/26/2014   Procedure: CYSTOSCOPY WITH RETROGRADE PYELOGRAM, URETEROSCOPY AND STENT PLACEMENT;  Surgeon: Bernestine Amass, MD;  Location: [facility];  Service: Urology;  Laterality: Bilateral;   HEMORRHOID SURGERY N/A 09/03/2013   Procedure: HEMORRHOIDECTOMY;  Surgeon: Leighton Ruff, MD;  Location: [facility];  Service: General;  Laterality: N/A;   HOLMIUM LASER APPLICATION Bilateral 22/97/9892   Procedure: HOLMIUM LASER APPLICATION;  Surgeon: Bernestine Amass, MD;  Location: [facility];  Service: Urology;  Laterality: Bilateral;   LITHOTRIPSY     POLYPECTOMY     SHOULDER SURGERY Right 05/2019   STONE EXTRACTION WITH BASKET Left 03/26/2014   Procedure: STONE EXTRACTION WITH BASKET;  Surgeon: Bernestine Amass, MD;  Location: [facility];  Service: Urology;  Laterality: Left;    Prior to Admission medications   Medication Sig Start Date End Date Taking? Authorizing Provider  amLODipine-benazepril (LOTREL) 5-20 MG capsule Take 1 capsule by mouth daily. 07/05/21  Yes Gottschalk, Leatrice Jewels M, DO  Ascorbic Acid (VITAMIN C ADULT GUMMIES PO) Take by mouth once a week. Take once a week   Yes [provider]  busPIRone (BUSPAR) 15 MG  tablet TAKE 1 TABLET BY MOUTH 2 TIMES DAILY. 06/20/21  Yes Gottschalk, Leatrice Jewels M, DO  hydrOXYzine (ATARAX) 50 MG tablet TAKE 1.5-2 TABLETS (75-100 MG TOTAL) BY MOUTH AT BEDTIME AS NEEDED FOR ANXIETY (SLEEP). 08/15/21  Yes Gottschalk, Leatrice Jewels M, DO  rosuvastatin (CRESTOR) 10 MG tablet Take 1 tablet (10 mg total) by mouth daily. 03/01/21  Yes Gottschalk, Leatrice Jewels M, DO  sertraline (ZOLOFT) 100 MG tablet TAKE 1.5 TABLETS (150MG  TOTAL) BY MOUTH DAILY 07/19/21  Yes Ronnie Doss M, DO  tamsulosin (FLOMAX) 0.4 MG CAPS capsule Take 1 capsule (0.4 mg total) by mouth daily. 06/19/21  Yes Gottschalk, Leatrice Jewels M, DO  ibuprofen (ADVIL) 800 MG tablet Take 1 tablet (800 mg total) by mouth every 8 (eight) hours as needed. Patient not taking: Reported on 06/19/2021 10/26/20   Gwenlyn Perking, FNP  varenicline (CHANTIX CONTINUING MONTH PAK) 1 MG tablet Take 1 tablet (1 mg total) by mouth 2 (two) times daily. Patient not taking: Reported on 08/10/2021 06/19/21   [name], DO  varenicline (CHANTIX PAK) 0.5 MG X 11 & 1 MG X 42 tablet Take one 0.5 mg tablet by mouth once daily for 3 days, then increase to one 0.5 mg tablet twice daily for 4 days, then increase to one 1 mg tablet twice daily. Patient not taking: Reported on 08/10/2021 06/19/21   [name], DO    Current Outpatient Medications  Medication Sig Dispense Refill   amLODipine-benazepril (  LOTREL) 5-20 MG capsule Take 1 capsule by mouth daily. 90 capsule 3   Ascorbic Acid (VITAMIN C ADULT GUMMIES PO) Take by mouth once a week. Take once a week     busPIRone (BUSPAR) 15 MG tablet TAKE 1 TABLET BY MOUTH 2 TIMES DAILY. 180 tablet 3   hydrOXYzine (ATARAX) 50 MG tablet TAKE 1.5-2 TABLETS (75-100 MG TOTAL) BY MOUTH AT BEDTIME AS NEEDED FOR ANXIETY (SLEEP). 180 tablet 3   rosuvastatin (CRESTOR) 10 MG tablet Take 1 tablet (10 mg total) by mouth daily. 90 tablet 3   sertraline (ZOLOFT) 100 MG tablet TAKE 1.5 TABLETS (150MG  TOTAL) BY MOUTH DAILY 135 tablet 0    tamsulosin (FLOMAX) 0.4 MG CAPS capsule Take 1 capsule (0.4 mg total) by mouth daily. 90 capsule 3   ibuprofen (ADVIL) 800 MG tablet Take 1 tablet (800 mg total) by mouth every 8 (eight) hours as needed. (Patient not taking: Reported on 06/19/2021) 30 tablet 0   varenicline (CHANTIX CONTINUING MONTH PAK) 1 MG tablet Take 1 tablet (1 mg total) by mouth 2 (two) times daily. (Patient not taking: Reported on 08/10/2021) 60 tablet 2   varenicline (CHANTIX PAK) 0.5 MG X 11 & 1 MG X 42 tablet Take one 0.5 mg tablet by mouth once daily for 3 days, then increase to one 0.5 mg tablet twice daily for 4 days, then increase to one 1 mg tablet twice daily. (Patient not taking: Reported on 08/10/2021) 53 tablet 0   Current Facility-Administered Medications  Medication Dose Route Frequency Provider Last Rate Last Admin   0.9 %  sodium chloride infusion  500 mL Intravenous Once Ladene Artist, MD        Allergies as of 08/24/2021   (No Known Allergies)    Family History  Problem Relation Age of Onset   Congestive Heart Failure Mother    ALS Father    Heart attack Brother 85   Healthy Son    Colon cancer Neg Hx    Colon polyps Neg Hx    Esophageal cancer Neg Hx    Rectal cancer Neg Hx    Stomach cancer Neg Hx     Social History   Socioeconomic History   Marital status: Married    Spouse name: Not on file   Number of children: 1   Years of education: Not on file   Highest education level: Not on file  Occupational History   Occupation: Office manager  Tobacco Use   Smoking status: Former    Packs/day: 1.00    Years: 21.00    Pack years: 21.00    Types: Cigarettes    Start date: 12/31/1992    Quit date: 07/09/2021    Years since quitting: 0.1   Smokeless tobacco: Never  Vaping Use   Vaping Use: Some days   Substances: Nicotine  Substance and Sexual Activity   Alcohol use: Yes    Comment: occasional-1-2 drinks every 2 weeks   Drug use: No   Sexual activity: Not Currently  Other  Topics Concern   Not on file  Social History Narrative   Not on file   Social Determinants of Health   Financial Resource Strain: Not on file  Food Insecurity: Not on file  Transportation Needs: Not on file  Physical Activity: Not on file  Stress: Not on file  Social Connections: Not on file  Intimate Partner Violence: Not on file    Review of Systems:  All systems reviewed an negative except  where noted in HPI.  Gen: Denies any fever, chills, sweats, anorexia, fatigue, weakness, malaise, weight loss, and sleep disorder CV: Denies chest pain, angina, palpitations, syncope, orthopnea, PND, peripheral edema, and claudication. Resp: Denies dyspnea at rest, dyspnea with exercise, cough, sputum, wheezing, coughing up blood, and pleurisy. GI: Denies vomiting blood, jaundice, and fecal incontinence.   Denies dysphagia or odynophagia. GU : Denies urinary burning, blood in urine, urinary frequency, urinary hesitancy, nocturnal urination, and urinary incontinence. MS: Denies joint pain, limitation of movement, and swelling, stiffness, low back pain, extremity pain. Denies muscle weakness, cramps, atrophy.  Derm: Denies rash, itching, dry skin, hives, moles, warts, or unhealing ulcers.  Psych: Denies depression, anxiety, memory loss, suicidal ideation, hallucinations, paranoia, and confusion. Heme: Denies bruising, bleeding, and enlarged lymph nodes. Neuro:  Denies any headaches, dizziness, paresthesias. Endo:  Denies any problems with DM, thyroid, adrenal function.   Physical Exam: General:  Alert, well-developed, in NAD Head:  Normocephalic and atraumatic. Eyes:  Sclera clear, no icterus.   Conjunctiva pink. Ears:  Normal auditory acuity. Mouth:  No deformity or lesions.  Neck:  Supple; no masses . Lungs:  Clear throughout to auscultation.   No wheezes, crackles, or rhonchi. No acute distress. Heart:  Regular rate and rhythm; no murmurs. Abdomen:  Soft, nondistended, nontender. No  masses, hepatomegaly. No obvious masses.  Normal bowel .    Rectal:  Deferred   Msk:  Symmetrical without gross deformities.. Pulses:  Normal pulses noted. Extremities:  Without edema. Neurologic:  Alert and  oriented x4;  grossly normal neurologically. Skin:  Intact without significant lesions or rashes. Cervical Nodes:  No significant cervical adenopathy. Psych:  Alert and cooperative. Normal mood and affect.   Impression / Plan:   Personal history of adenomatous colon polyp in 2010 here for surveillance colonoscopy.  Pricilla Riffle. Fuller Plan  08/24/2021, 9:08 AM See Shea Evans, [facility], to contact our on call provider

## 2021-08-24 NOTE — Progress Notes (Signed)
Called to room to assist during endoscopic procedure.  Patient ID and intended procedure confirmed with present staff. Received instructions for my participation in the procedure from the performing physician.  

## 2021-08-24 NOTE — Progress Notes (Signed)
Pt's states no medical or surgical changes since previsit or office visit.  VS CW  

## 2021-08-24 NOTE — Patient Instructions (Signed)
Resume previous diet and medications. °Awaiting pathology results. Repeat Colonoscopy date to be determined based on pathology results. ° °YOU HAD AN ENDOSCOPIC PROCEDURE TODAY AT THE [facility]:   Refer to the procedure report that was given to you for any specific questions about what was found during the examination.  If the procedure report does not answer your questions, please call your gastroenterologist to clarify.  If you requested that your care partner not be given the details of your procedure findings, then the procedure report has been included in a sealed envelope for you to review at your convenience later. ° °YOU SHOULD EXPECT: Some feelings of bloating in the abdomen. Passage of more gas than usual.  Walking can help get rid of the air that was put into your GI tract during the procedure and reduce the bloating. If you had a lower endoscopy (such as a colonoscopy or flexible sigmoidoscopy) you may notice spotting of blood in your stool or on the toilet paper. If you underwent a bowel prep for your procedure, you may not have a normal bowel movement for a few days. ° °Please Note:  You might notice some irritation and congestion in your nose or some drainage.  This is from the oxygen used during your procedure.  There is no need for concern and it should clear up in a day or so. ° °SYMPTOMS TO REPORT IMMEDIATELY: ° °Following lower endoscopy (colonoscopy or flexible sigmoidoscopy): ° Excessive amounts of blood in the stool ° Significant tenderness or worsening of abdominal pains ° Swelling of the abdomen that is new, acute ° Fever of 100°F or higher ° °For urgent or emergent issues, a gastroenterologist can be reached at any hour by calling [phone]. °Do not use MyChart messaging for urgent concerns.  ° ° °DIET:  We do recommend a small meal at first, but then you may proceed to your regular diet.  Drink plenty of fluids but you should avoid alcoholic beverages for 24  hours. ° °ACTIVITY:  You should plan to take it easy for the rest of today and you should NOT DRIVE or use heavy machinery until tomorrow (because of the sedation medicines used during the test).   ° °FOLLOW UP: °Our staff will call the number listed on your records 48-72 hours following your procedure to check on you and address any questions or concerns that you may have regarding the information given to you following your procedure. If we do not reach you, we will leave a message.  We will attempt to reach you two times.  During this call, we will ask if you have developed any symptoms of COVID 19. If you develop any symptoms (ie: fever, flu-like symptoms, shortness of breath, cough etc.) before then, please call [phone].  If you test positive for Covid 19 in the 2 weeks post procedure, please call and report this information to us.   ° °If any biopsies were taken you will be contacted by phone or by letter within the next 1-3 weeks.  Please call us at [phone] if you have not heard about the biopsies in 3 weeks.  ° ° °SIGNATURES/CONFIDENTIALITY: °You and/or your care partner have signed paperwork which will be entered into your electronic medical record.  These signatures attest to the fact that that the information above on your After Visit Summary has been reviewed and is understood.  Full responsibility of the confidentiality of this discharge information lies with you and/or your care-partner.  °

## 2021-08-24 NOTE — Op Note (Signed)
[name] [name] Name: [name] [name] Procedure Date: 08/24/2021 8:58 AM MRN: [id number] Endoscopist: Ladene Artist , MD Age: 56 Referring MD:  Date of Birth: April 08, 1966 Gender: Male Account #: [id number] Procedure:                Colonoscopy Indications:              Surveillance: Personal history of adenomatous                            polyps on last colonoscopy > 5 years ago Medicines:                Monitored Anesthesia Care Procedure:                Pre-Anesthesia Assessment:                           - Prior to the procedure, a History and Physical                            was performed, and [name] medications and                            allergies were reviewed. The [name]'s tolerance of                            previous anesthesia was also reviewed. The risks                            and benefits of the procedure and the sedation                            options and risks were discussed with the [name].                            All questions were answered, and informed consent                            was obtained. Prior Anticoagulants: The [name] has                            taken no previous anticoagulant or antiplatelet                            agents. ASA Grade Assessment: II - A [name] with                            mild systemic disease. After reviewing the risks                            and benefits, the [name] was deemed in                            satisfactory condition to undergo the procedure.  After obtaining informed consent, the colonoscope                            was passed under direct vision. Throughout the                            procedure, the [name]'s blood pressure, pulse, and                            oxygen saturations were monitored continuously. The                            Olympus CF-HQ190L ([id number]) Colonoscope was                            introduced through the anus  and advanced to the the                            cecum, identified by appendiceal orifice and                            ileocecal valve. The ileocecal valve, appendiceal                            orifice, and rectum were photographed. The quality                            of the bowel preparation was good. The colonoscopy                            was performed without difficulty. The [name]                            tolerated the procedure well. Scope In: 9:11:01 AM Scope Out: 9:25:09 AM Scope Withdrawal Time: 0 hours 11 minutes 51 seconds  Total Procedure Duration: 0 hours 14 minutes 8 seconds  Findings:                 The perianal and digital rectal examinations were                            normal.                           A 6 mm polyp was found in the descending colon. The                            polyp was sessile. The polyp was removed with a                            cold snare. Resection and retrieval were complete.                           Internal hemorrhoids were found during  retroflexion. The hemorrhoids were small and Grade                            I (internal hemorrhoids that do not prolapse).                           The exam was otherwise without abnormality on                            direct and retroflexion views. Complications:            No immediate complications. Estimated blood loss:                            None. Estimated Blood Loss:     Estimated blood loss: none. Impression:               - One 6 mm polyp in the descending colon, removed                            with a cold snare. Resected and retrieved.                           - Internal hemorrhoids.                           - The examination was otherwise normal on direct                            and retroflexion views. Recommendation:           - Repeat colonoscopy date to be determined after                            pending pathology results are  reviewed for                            surveillance based on pathology results.                           - [name] has a contact number available for                            emergencies. The signs and symptoms of potential                            delayed complications were discussed with the                            [name]. Return to normal activities tomorrow.                            Written discharge instructions were provided to the                            [name].                           -  Resume previous diet.                           - Continue present medications.                           - Await pathology results. Ladene Artist, MD 08/24/2021 9:27:28 AM This report has been signed electronically.

## 2021-08-24 NOTE — Progress Notes (Signed)
Vss nad trans to pacu °

## 2021-08-29 ENCOUNTER — Telehealth: Payer: Self-pay

## 2021-08-29 NOTE — Telephone Encounter (Signed)
°  Follow up Call-  Call back number 08/24/2021  Post procedure Call Back phone  # [phone]  Permission to leave phone message Yes  Some recent data might be hidden     Patient questions:  Do you have a fever, pain , or abdominal swelling? No. Pain Score  0 *  Have you tolerated food without any problems? Yes.    Have you been able to return to your normal activities? Yes.    Do you have any questions about your discharge instructions: Diet   No. Medications  No. Follow up visit  No.  Do you have questions or concerns about your Care? No.  Actions: * If pain score is 4 or above: No action needed, pain <4.

## 2021-09-03 ENCOUNTER — Encounter: Payer: Self-pay | Admitting: Gastroenterology

## 2022-02-06 ENCOUNTER — Emergency Department (HOSPITAL_BASED_OUTPATIENT_CLINIC_OR_DEPARTMENT_OTHER)
Admission: EM | Admit: 2022-02-06 | Discharge: 2022-02-06 | Disposition: A | Discharge status: Home / Self Care | Payer: BC Managed Care – PPO | Attending: Emergency Medicine | Admitting: Emergency Medicine

## 2022-02-06 ENCOUNTER — Emergency Department (HOSPITAL_BASED_OUTPATIENT_CLINIC_OR_DEPARTMENT_OTHER): Payer: BC Managed Care – PPO

## 2022-02-06 ENCOUNTER — Other Ambulatory Visit: Payer: Self-pay

## 2022-02-06 ENCOUNTER — Encounter (HOSPITAL_BASED_OUTPATIENT_CLINIC_OR_DEPARTMENT_OTHER): Payer: Self-pay | Admitting: Obstetrics and Gynecology

## 2022-02-06 DIAGNOSIS — I10 Essential (primary) hypertension: Secondary | ICD-10-CM | POA: Insufficient documentation

## 2022-02-06 DIAGNOSIS — Z79899 Other long term (current) drug therapy: Secondary | ICD-10-CM | POA: Insufficient documentation

## 2022-02-06 DIAGNOSIS — N1330 Unspecified hydronephrosis: Secondary | ICD-10-CM | POA: Diagnosis not present

## 2022-02-06 DIAGNOSIS — R109 Unspecified abdominal pain: Secondary | ICD-10-CM | POA: Diagnosis not present

## 2022-02-06 DIAGNOSIS — N201 Calculus of ureter: Secondary | ICD-10-CM | POA: Insufficient documentation

## 2022-02-06 DIAGNOSIS — N132 Hydronephrosis with renal and ureteral calculous obstruction: Secondary | ICD-10-CM

## 2022-02-06 LAB — URINALYSIS, ROUTINE W REFLEX MICROSCOPIC
Bilirubin Urine: NEGATIVE
Glucose, UA: NEGATIVE mg/dL
Ketones, ur: NEGATIVE mg/dL
Nitrite: NEGATIVE
Protein, ur: NEGATIVE mg/dL
RBC / HPF: >50 RBC/hpf — ABNORMAL HIGH (ref 0–5)
Specific Gravity, Urine: 1.007 (ref 1.005–1.030)
pH: 6.5 (ref 5.0–8.0)

## 2022-02-06 LAB — CBC
HCT: 43.5 % (ref 39.0–52.0)
Hemoglobin: 14.9 g/dL (ref 13.0–17.0)
MCH: 29.6 pg (ref 26.0–34.0)
MCHC: 34.3 g/dL (ref 30.0–36.0)
MCV: 86.5 fL (ref 80.0–100.0)
Platelets: 218 10*3/uL (ref 150–400)
RBC: 5.03 MIL/uL (ref 4.22–5.81)
RDW: 13.7 % (ref 11.5–15.5)
WBC: 10.6 10*3/uL — ABNORMAL HIGH (ref 4.0–10.5)
nRBC: 0 % (ref 0.0–0.2)

## 2022-02-06 LAB — BASIC METABOLIC PANEL
Anion gap: 9 (ref 5–15)
BUN: 11 mg/dL (ref 6–20)
CO2: 25 mmol/L (ref 22–32)
Calcium: 9.1 mg/dL (ref 8.9–10.3)
Chloride: 105 mmol/L (ref 98–111)
Creatinine, Ser: 0.86 mg/dL (ref 0.61–1.24)
GFR, Estimated: >60 mL/min (ref >60)
Glucose, Bld: 78 mg/dL (ref 70–99)
Potassium: 4.1 mmol/L (ref 3.5–5.1)
Sodium: 139 mmol/L (ref 135–145)

## 2022-02-06 MED ORDER — ONDANSETRON 8 MG PO TBDP: 8.0000 mg | ORAL_TABLET | Freq: Three times a day (TID) | ORAL | 0 refills | Status: DC | PRN

## 2022-02-06 MED ORDER — ONDANSETRON HCL 4 MG/2ML IJ SOLN
4.0000 mg | Freq: Once | INTRAMUSCULAR | Status: AC
  Administered 2022-02-06: 4 mg via INTRAVENOUS
  Filled 2022-02-06: qty 2

## 2022-02-06 MED ORDER — HYDROMORPHONE HCL 1 MG/ML IJ SOLN
1.0000 mg | Freq: Once | INTRAMUSCULAR | Status: AC
  Administered 2022-02-06: 1 mg via INTRAVENOUS
  Filled 2022-02-06: qty 1

## 2022-02-06 MED ORDER — NAPROXEN 375 MG PO TABS: 375.0000 mg | ORAL_TABLET | Freq: Two times a day (BID) | ORAL | 0 refills | Status: DC

## 2022-02-06 MED ORDER — KETOROLAC TROMETHAMINE 30 MG/ML IJ SOLN
30.0000 mg | Freq: Once | INTRAMUSCULAR | Status: AC
  Administered 2022-02-06: 30 mg via INTRAVENOUS
  Filled 2022-02-06: qty 1

## 2022-02-06 MED ORDER — HYDROCODONE-ACETAMINOPHEN 5-325 MG PO TABS: 1.0000 | ORAL_TABLET | Freq: Four times a day (QID) | ORAL | 0 refills | Status: DC | PRN

## 2022-02-06 NOTE — ED Notes (Signed)
Reviewed AVS/discharge instruction with patient. Time allotted for and all questions answered. Patient is agreeable for d/c and escorted to ed exit by staff.  

## 2022-02-06 NOTE — ED Triage Notes (Signed)
Patient reports to the ER for right sided flank pain. Hx of kidney stones states that's what this feels like.

## 2022-02-06 NOTE — ED Provider Notes (Signed)
[facility] EMERGENC[facility] DEPT Provider Note   CSN: [id number] Arrival date & time: 02/06/22  1541     History  Chief Complaint  Patient presents with   Flank Pain    [name] [name] is a 56 y.o. male.   Flank Pain   Patient presents to the ER for evaluation of right flank pain.  Patient has a history of hypertension depression, kidney stones, hyperlipidemia.  Patient states he had acute onset of severe pain in his right flank area that is rating down to his testicle.  He has not noticed any swelling.  No vomiting or diarrhea no fevers.  Patient states this feels very similar to prior kidney stones.    Home Medications Prior to Admission medications   Medication Sig Start Date End Date Taking? Authorizing Provider  HYDROcodone-acetaminophen (NORCO/VICODIN) 5-325 MG tablet Take 1 tablet by mouth every 6 (six) hours as needed. 02/06/22  Yes Dorie Rank, MD  naproxen (NAPROSYN) 375 MG tablet Take 1 tablet (375 mg total) by mouth 2 (two) times daily. 02/06/22  Yes Dorie Rank, MD  ondansetron (ZOFRAN-ODT) 8 MG disintegrating tablet Take 1 tablet (8 mg total) by mouth every 8 (eight) hours as needed for nausea or vomiting. 02/06/22  Yes Dorie Rank, MD  amLODipine-benazepril (LOTREL) 5-20 MG capsule Take 1 capsule by mouth daily. 07/05/21   [name], DO  Ascorbic Acid (VITAMIN C ADULT GUMMIES PO) Take by mouth once a week. Take once a week    [provider]  busPIRone (BUSPAR) 15 MG tablet TAKE 1 TABLET BY MOUTH 2 TIMES DAILY. 06/20/21   [name], DO  hydrOXYzine (ATARAX) 50 MG tablet TAKE 1.5-2 TABLETS (75-100 MG TOTAL) BY MOUTH AT BEDTIME AS NEEDED FOR ANXIETY (SLEEP). 08/15/21   [name], DO  ibuprofen (ADVIL) 800 MG tablet Take 1 tablet (800 mg total) by mouth every 8 (eight) hours as needed. Patient not taking: Reported on 06/19/2021 10/26/20   Gwenlyn Perking, FNP  rosuvastatin (CRESTOR) 10 MG tablet Take 1 tablet (10 mg total) by mouth  daily. 03/01/21   Ronnie Doss M, DO  sertraline (ZOLOFT) 100 MG tablet TAKE 1.5 TABLETS ('150MG'$  TOTAL) BY MOUTH DAILY 07/19/21   Ronnie Doss M, DO  tamsulosin (FLOMAX) 0.4 MG CAPS capsule Take 1 capsule (0.4 mg total) by mouth daily. 06/19/21   [name], DO  varenicline (CHANTIX CONTINUING MONTH PAK) 1 MG tablet Take 1 tablet (1 mg total) by mouth 2 (two) times daily. Patient not taking: Reported on 08/10/2021 06/19/21   [name], DO  varenicline (CHANTIX PAK) 0.5 MG X 11 & 1 MG X 42 tablet Take one 0.5 mg tablet by mouth once daily for 3 days, then increase to one 0.5 mg tablet twice daily for 4 days, then increase to one 1 mg tablet twice daily. Patient not taking: Reported on 08/10/2021 06/19/21   [name], DO      Allergies    Patient has no known allergies.    Review of Systems   Review of Systems  Genitourinary:  Positive for flank pain.    Physical Exam Updated Vital Signs BP (!) 141/110   Pulse 64   Temp 99.9 F (37.7 C)   Resp 18   Ht 1.803 m ('5\' 11"'$ )   Wt 81.6 kg   SpO2 97%   BMI 25.10 kg/m  Physical Exam Vitals and nursing note reviewed.  Constitutional:      Appearance: He is  well-developed. He is not diaphoretic.  HENT:     Head: Normocephalic and atraumatic.     Right Ear: External ear normal.     Left Ear: External ear normal.  Eyes:     General: No scleral icterus.       Right eye: No discharge.        Left eye: No discharge.     Conjunctiva/sclera: Conjunctivae normal.  Neck:     Trachea: No tracheal deviation.  Cardiovascular:     Rate and Rhythm: Normal rate and regular rhythm.  Pulmonary:     Effort: Pulmonary effort is normal. No respiratory distress.     Breath sounds: Normal breath sounds. No stridor. No wheezing or rales.  Abdominal:     General: Bowel sounds are normal. There is no distension.     Palpations: Abdomen is soft.     Tenderness: There is abdominal tenderness. There is right CVA tenderness.  There is no guarding or rebound.     Comments: Tenderness palpation right lower quadrant  Genitourinary:    Comments: No inguinal mass Musculoskeletal:        General: No tenderness or deformity.     Cervical back: Neck supple.  Skin:    General: Skin is warm and dry.     Findings: No rash.  Neurological:     General: No focal deficit present.     Mental Status: He is alert.     Cranial Nerves: No cranial nerve deficit (no facial droop, extraocular movements intact, no slurred speech).     Sensory: No sensory deficit.     Motor: No abnormal muscle tone or seizure activity.     Coordination: Coordination normal.  Psychiatric:        Mood and Affect: Mood normal.     ED Results / Procedures / Treatments   Labs (all labs ordered are listed, but only abnormal results are displayed) Labs Reviewed  URINALYSIS, ROUTINE W REFLEX MICROSCOPIC - Abnormal; Notable for the following components:      Result Value   Hgb urine dipstick LARGE (*)    Leukocytes,Ua TRACE (*)    RBC / HPF >50 (*)    All other components within normal limits  CBC - Abnormal; Notable for the following components:   WBC 10.6 (*)    All other components within normal limits  BASIC METABOLIC PANEL    EKG None  Radiology CT Renal Stone Study  Result Date: 02/06/2022 CLINICAL DATA:  Flank pain. Kidney stone suspected. Right-sided symptoms. EXAM: CT ABDOMEN AND PELVIS WITHOUT CONTRAST TECHNIQUE: Multidetector CT imaging of the abdomen and pelvis was performed following the standard protocol without IV contrast. RADIATION DOSE REDUCTION: This exam was performed according to the departmental dose-optimization program which includes automated exposure control, adjustment of the mA and/or kV according to patient size and/or use of iterative reconstruction technique. COMPARISON:  10/27/2019 FINDINGS: Lower chest: Multiple benign peripheral/subpleural nodules as shown previously. No change. No further follow-up necessary.  Hepatobiliary: Liver parenchyma is normal without contrast. No calcified gallstones. Pancreas: Normal Spleen: Normal Adrenals/Urinary Tract: Adrenal glands are normal. Two or 3 2 mm nonobstructing stones in the right kidney. Approximately 10 small nonobstructing stones in the left kidney. The left kidney does not show hydroureteronephrosis. There is pronounced hydroureteronephrosis on the right with the ureter dilated to the pelvic brim where there is a 3 x 4 mm stone responsible for the ureteral obstruction. No stone in the bladder. Stomach/Bowel: Stomach, small bowel and colon  are normal. Vascular/Lymphatic: Aortic atherosclerosis. No aneurysm. IVC is normal. No adenopathy. Reproductive: Normal Other: No free fluid or air. Musculoskeletal: Ordinary spinal degenerative changes are noted. IMPRESSION: Pronounced hydroureteronephrosis on the right due to a 3 x 4 mm stone at the pelvic brim level. Few 2 mm nonobstructing stones otherwise in the right kidney. 10-12 small nonobstructing stones in the left kidney. Numerous small nodules at the lung bases as seen previously, unchanged, benign and not in need of further follow-up as seen. Electronically Signed   By: Nelson Chimes M.D.   On: 02/06/2022 16:46    Procedures Procedures    Medications Ordered in ED Medications  ketorolac (TORADOL) 30 MG/ML injection 30 mg (30 mg Intravenous Given 02/06/22 1616)  ondansetron (ZOFRAN) injection 4 mg (4 mg Intravenous Given 02/06/22 1615)  HYDROmorphone (DILAUDID) injection 1 mg (1 mg Intravenous Given 02/06/22 1744)    ED Course/ Medical Decision Making/ A&P Clinical Course as of 02/06/22 1801  Tue Feb 06, 2022  1649 Urinalysis, Routine w reflex microscopic(!) Hematuria noted [JK]  2956 Basic metabolic panel Normal [JK]  1649 CBC(!) Normal [JK]  1649 Ct scan shows ureteral stone and hydronephrosis [JK]    Clinical Course User Index [JK] Dorie Rank, MD                           Medical Decision Making Problems  Addressed: Ureteral stone with hydronephrosis: acute illness or injury that poses a threat to life or bodily functions  Amount and/or Complexity of Data Reviewed Labs: ordered. Decision-making details documented in ED Course. Radiology: ordered and independent interpretation performed.  Risk Prescription drug management. Parenteral controlled substances.   Patient presents to the ED with complaints of acute flank pain.  Patient's ED work-up confirms a right-sided kidney stone.  Patient has hematuria and a stone in the right ureter with hydronephrosis.  She was treated with IV Toradol and Dilaudid with improvement in his symptoms.  No signs of infection.  No renal failure.  We will plan on discharge home with urine strainer and outpatient urology follow-up.        Final Clinical Impression(s) / ED Diagnoses Final diagnoses:  Ureteral stone with hydronephrosis    Rx / DC Orders ED Discharge Orders          Ordered    HYDROcodone-acetaminophen (NORCO/VICODIN) 5-325 MG tablet  Every 6 hours PRN        02/06/22 1759    ondansetron (ZOFRAN-ODT) 8 MG disintegrating tablet  Every 8 hours PRN        02/06/22 1759    naproxen (NAPROSYN) 375 MG tablet  2 times daily        02/06/22 1759              Dorie Rank, MD 02/06/22 [phone]

## 2022-02-06 NOTE — Discharge Instructions (Addendum)
Take the medications as needed for pain and nausea.  Use a urine strainer to help determine if you pass the stone. follow-up with the urologist for further evaluation.

## 2022-04-07 ENCOUNTER — Other Ambulatory Visit: Payer: Self-pay | Admitting: Family Medicine

## 2022-04-30 ENCOUNTER — Other Ambulatory Visit: Payer: Self-pay | Admitting: Family Medicine

## 2022-04-30 DIAGNOSIS — F411 Generalized anxiety disorder: Secondary | ICD-10-CM

## 2022-05-28 ENCOUNTER — Other Ambulatory Visit: Payer: Self-pay | Admitting: Family Medicine

## 2022-05-28 DIAGNOSIS — F411 Generalized anxiety disorder: Secondary | ICD-10-CM

## 2022-07-06 ENCOUNTER — Other Ambulatory Visit: Payer: Self-pay | Admitting: Family Medicine

## 2022-07-06 DIAGNOSIS — F411 Generalized anxiety disorder: Secondary | ICD-10-CM

## 2022-07-10 ENCOUNTER — Other Ambulatory Visit: Payer: Self-pay | Admitting: Family Medicine

## 2022-08-28 ENCOUNTER — Other Ambulatory Visit: Payer: Self-pay | Admitting: Family Medicine

## 2022-08-28 DIAGNOSIS — I10 Essential (primary) hypertension: Secondary | ICD-10-CM

## 2022-09-09 ENCOUNTER — Other Ambulatory Visit: Payer: Self-pay | Admitting: Family Medicine

## 2022-09-09 DIAGNOSIS — F411 Generalized anxiety disorder: Secondary | ICD-10-CM

## 2022-09-13 ENCOUNTER — Other Ambulatory Visit: Payer: Self-pay | Admitting: Family Medicine

## 2022-09-13 DIAGNOSIS — Z87442 Personal history of urinary calculi: Secondary | ICD-10-CM

## 2022-09-21 ENCOUNTER — Ambulatory Visit (INDEPENDENT_AMBULATORY_CARE_PROVIDER_SITE_OTHER): Payer: BC Managed Care – PPO | Admitting: Family Medicine

## 2022-09-21 ENCOUNTER — Telehealth: Payer: Self-pay | Admitting: Family Medicine

## 2022-09-21 ENCOUNTER — Encounter: Payer: Self-pay | Admitting: Family Medicine

## 2022-09-21 VITALS — BP 113/72 | HR 81 | Temp 98.7°F | Ht 71.0 in | Wt 181.0 lb

## 2022-09-21 DIAGNOSIS — R5382 Chronic fatigue, unspecified: Secondary | ICD-10-CM | POA: Diagnosis not present

## 2022-09-21 DIAGNOSIS — Z87442 Personal history of urinary calculi: Secondary | ICD-10-CM | POA: Diagnosis not present

## 2022-09-21 DIAGNOSIS — Z0000 Encounter for general adult medical examination without abnormal findings: Secondary | ICD-10-CM

## 2022-09-21 DIAGNOSIS — E7800 Pure hypercholesterolemia, unspecified: Secondary | ICD-10-CM | POA: Diagnosis not present

## 2022-09-21 DIAGNOSIS — Z0001 Encounter for general adult medical examination with abnormal findings: Secondary | ICD-10-CM | POA: Diagnosis not present

## 2022-09-21 DIAGNOSIS — I10 Essential (primary) hypertension: Secondary | ICD-10-CM | POA: Diagnosis not present

## 2022-09-21 DIAGNOSIS — Z720 Tobacco use: Secondary | ICD-10-CM | POA: Diagnosis not present

## 2022-09-21 DIAGNOSIS — R6889 Other general symptoms and signs: Secondary | ICD-10-CM | POA: Diagnosis not present

## 2022-09-21 DIAGNOSIS — F411 Generalized anxiety disorder: Secondary | ICD-10-CM | POA: Diagnosis not present

## 2022-09-21 DIAGNOSIS — Z125 Encounter for screening for malignant neoplasm of prostate: Secondary | ICD-10-CM | POA: Diagnosis not present

## 2022-09-21 MED ORDER — SERTRALINE HCL 100 MG PO TABS: 100.0000 mg | ORAL_TABLET | Freq: Every day | ORAL | 3 refills | Status: DC

## 2022-09-21 MED ORDER — ROSUVASTATIN CALCIUM 10 MG PO TABS: 10.0000 mg | ORAL_TABLET | Freq: Every day | ORAL | 3 refills | Status: DC

## 2022-09-21 MED ORDER — TAMSULOSIN HCL 0.4 MG PO CAPS: 0.4000 mg | ORAL_CAPSULE | Freq: Every day | ORAL | 3 refills | Status: DC

## 2022-09-21 MED ORDER — AMLODIPINE BESY-BENAZEPRIL HCL 5-20 MG PO CAPS: 1.0000 | ORAL_CAPSULE | Freq: Every day | ORAL | 3 refills | Status: DC

## 2022-09-21 MED ORDER — BUSPIRONE HCL 15 MG PO TABS: 15.0000 mg | ORAL_TABLET | Freq: Two times a day (BID) | ORAL | 3 refills | Status: DC

## 2022-09-21 NOTE — Telephone Encounter (Signed)
Left vm for cb

## 2022-09-21 NOTE — Progress Notes (Unsigned)
[name] [name] is a 57 y.o. male presents to office today for annual physical exam examination.    Concerns today include: Renal stones Patient continues to suffer from renal stones.  He was under the care of Dr. Karsten [name] before but is not sure if he is still practicing.  Will need a new referral to a new urologist.  He denies any hematuria, difficulty passing urine or pain with urination currently but does note that he has several stones in place  Irritability, anxiety Patient feels that he is stable with the sertraline 100 mg daily and buspirone 15 mg twice daily.  He has rare use of the Atarax but does still have plenty if needed.  He and his wife have adopted [name] [name], an 8-year-old boy that is the child of one of his nieces.  He does note that this is a bit of a challenge as the child suffers from ODD.  Sometimes he becomes irritable and this is more observed by his wife.  His energy is poor at times but he admits that he only sleeps roughly 5 hours per night.  This is due to having multiple responsibilities when he comes home from work.  This is how it always been however.  Marital status: married, Substance use: Tobacco use.  Currently down to 1/3 pack/day but has been a smoker for well over 35 years and was really about a 2 pack/day smoker for about 20 of those years.  He denies any hemoptysis, unplanned weight loss, night sweats, shortness of breath, difficulty swallowing or sore throat Diet: Low in vegetables due to renal stones.  He read somewhere that greens could increase kidney stone formation, Exercise: No structured Last colonoscopy: UTD Refills needed today: all Immunizations needed: Immunization History  Administered Date(s) Administered   Influenza,inj,Quad PF,6+ Mos 05/24/2021   Moderna Sars-Covid-2 Vaccination 03/18/2020     Past Medical History:  Diagnosis Date   Anxiety    Bulging discs    Chronic kidney disease    kidney stones   Depression    Hyperlipidemia     Hypertension    Irregular heart beat    25 years ago   Kidney stones    Social History   Socioeconomic History   Marital status: Married    Spouse name: Not on file   Number of children: 1   Years of education: Not on file   Highest education level: Not on file  Occupational History   Occupation: Office manager  Tobacco Use   Smoking status: Some Days    Packs/day: 1.00    Years: 21.00    Additional pack years: 0.00    Total pack years: 21.00    Types: Cigarettes    Start date: 12/31/1992    Last attempt to quit: 07/09/2021    Years since quitting: 1.2    Passive exposure: Never   Smokeless tobacco: Never  Vaping Use   Vaping Use: Some days   Substances: Nicotine  Substance and Sexual Activity   Alcohol use: Yes    Comment: occasional-1-2 drinks every 2 weeks   Drug use: No   Sexual activity: Not Currently  Other Topics Concern   Not on file  Social History Narrative   Not on file   Social Determinants of Health   Financial Resource Strain: Not on file  Food Insecurity: Not on file  Transportation Needs: Not on file  Physical Activity: Not on file  Stress: Not on file  Social Connections: Not on  file  Intimate Partner Violence: Not on file   Past Surgical History:  Procedure Laterality Date   COLONOSCOPY     CYSTOSCOPY W/ URETERAL STENT PLACEMENT  09/2011   CYSTOSCOPY W/ URETERAL STENT REMOVAL  10/2011   CYSTOSCOPY WITH RETROGRADE PYELOGRAM, URETEROSCOPY AND STENT PLACEMENT Bilateral 03/26/2014   Procedure: CYSTOSCOPY WITH RETROGRADE PYELOGRAM, URETEROSCOPY AND STENT PLACEMENT;  Surgeon: Bernestine Amass, MD;  Location: [facility];  Service: Urology;  Laterality: Bilateral;   HEMORRHOID SURGERY N/A 09/03/2013   Procedure: HEMORRHOIDECTOMY;  Surgeon: Leighton Ruff, MD;  Location: [facility];  Service: General;  Laterality: N/A;   HOLMIUM LASER APPLICATION Bilateral [id number]   Procedure: HOLMIUM LASER APPLICATION;  Surgeon: Bernestine Amass, MD;  Location: [facility];  Service: Urology;  Laterality: Bilateral;   LITHOTRIPSY     POLYPECTOMY     SHOULDER SURGERY Right 05/2019   STONE EXTRACTION WITH BASKET Left 03/26/2014   Procedure: STONE EXTRACTION WITH BASKET;  Surgeon: Bernestine Amass, MD;  Location: [facility];  Service: Urology;  Laterality: Left;   Family History  Problem Relation Age of Onset   Congestive Heart Failure Mother    ALS Father    Heart attack Brother 47   Healthy Son    Colon cancer Neg Hx    Colon polyps Neg Hx    Esophageal cancer Neg Hx    Rectal cancer Neg Hx    Stomach cancer Neg Hx     Current Outpatient Medications:    amLODipine-benazepril (LOTREL) 5-20 MG capsule, Take 1 capsule by mouth daily., Disp: 90 capsule, Rfl: 3   Ascorbic Acid (VITAMIN C ADULT GUMMIES PO), Take by mouth once a week. Take once a week, Disp: , Rfl:    busPIRone (BUSPAR) 15 MG tablet, TAKE 1 TABLET BY MOUTH 2 TIMES DAILY., Disp: 180 tablet, Rfl: 3   hydrOXYzine (ATARAX) 50 MG tablet, TAKE 1.5-2 TABLETS (75-100 MG TOTAL) BY MOUTH AT BEDTIME AS NEEDED FOR ANXIETY (SLEEP)., Disp: 180 tablet, Rfl: 3   ondansetron (ZOFRAN-ODT) 8 MG disintegrating tablet, Take 1 tablet (8 mg total) by mouth every 8 (eight) hours as needed for nausea or vomiting., Disp: 12 tablet, Rfl: 0   rosuvastatin (CRESTOR) 10 MG tablet, TAKE 1 TABLET BY MOUTH EVERY DAY, Disp: 90 tablet, Rfl: 0   sertraline (ZOLOFT) 100 MG tablet, Take 1.5 tablets (150 mg total) by mouth daily. (NEEDS TO BE SEEN BEFORE NEXT REFILL), Disp: 45 tablet, Rfl: 0   tamsulosin (FLOMAX) 0.4 MG CAPS capsule, TAKE 1 CAPSULE BY MOUTH EVERY DAY, Disp: 90 capsule, Rfl: 0   varenicline (CHANTIX CONTINUING MONTH PAK) 1 MG tablet, Take 1 tablet (1 mg total) by mouth 2 (two) times daily. (Patient not taking: Reported on 08/10/2021), Disp: 60 tablet, Rfl: 2   varenicline (CHANTIX PAK) 0.5 MG X 11 & 1 MG X 42 tablet, Take one 0.5 mg tablet by mouth once daily for 3 days, then increase to one 0.5 mg tablet twice daily for 4  days, then increase to one 1 mg tablet twice daily. (Patient not taking: Reported on 08/10/2021), Disp: 53 tablet, Rfl: 0  No Known Allergies   ROS: Review of Systems Pertinent items noted in HPI and remainder of comprehensive ROS otherwise negative.    Physical exam BP 113/72   Pulse 81   Temp 98.7 F (37.1 C)   Ht 5\' 11"  (1.803 m)   Wt 181 lb (82.1 kg)   SpO2 95%   BMI 25.24 kg/m  General  appearance: alert, cooperative, appears stated age, and no distress Head: Normocephalic, without obvious abnormality, atraumatic Eyes: negative findings: lids and lashes normal, conjunctivae and sclerae normal, corneas clear, and pupils equal, round, reactive to light and accomodation Ears: normal TM's and external ear canals both ears Nose: Nares normal. Septum midline. Mucosa normal. No drainage or sinus tenderness. Throat: lips, mucosa, and tongue normal; teeth and gums normal Neck: no adenopathy, supple, symmetrical, trachea midline, and thyroid not enlarged, symmetric, no tenderness/mass/nodules Back: symmetric, no curvature. ROM normal. No CVA tenderness. Lungs: clear to auscultation bilaterally Chest wall: no tenderness Heart: regular rate and rhythm, S1, S2 normal, no murmur, click, rub or gallop Abdomen: soft, non-tender; bowel sounds normal; no masses,  no organomegaly Extremities: extremities normal, atraumatic, no cyanosis or edema Pulses: 2+ and symmetric Skin: Skin color, texture, turgor normal. No rashes or lesions Lymph nodes: Cervical, supraclavicular, and axillary nodes normal. Neurologic: Grossly normal Psych: Mood stable, speech normal, affect appropriate.  Patient very pleasant, interactive     09/21/2022    1:49 PM 06/19/2021    3:21 PM 05/24/2021    4:45 PM  Depression screen PHQ 2/9  Decreased Interest 1 1 0  Down, Depressed, Hopeless 1 1 2   PHQ - 2 Score 2 2 2   Altered sleeping 3 1 2   Tired, decreased energy 1 2 1   Change in appetite 1 0 0  Feeling bad or  failure about yourself  1 1 0  Trouble concentrating 3 2 2   Moving slowly or fidgety/restless 1 1 2   Suicidal thoughts 0 0 0  PHQ-9 Score 12 9 9   Difficult doing work/chores  Somewhat difficult Somewhat difficult      09/21/2022    1:43 PM 06/19/2021    3:22 PM 05/24/2021    4:45 PM 02/22/2021    3:39 PM  GAD 7 : Generalized Anxiety Score  Nervous, Anxious, on Edge 0 2 2 3   Control/stop worrying 0 3 3 3   Worry too much - different things 2 2 2 3   Trouble relaxing 2 3 3 2   Restless 1 2 2 2   Easily annoyed or irritable 2 2 2 3   Afraid - awful might happen 0 2 2 3   Total GAD 7 Score 7 16 16 19   Anxiety Difficulty  Somewhat difficult Somewhat difficult     Assessment/ Plan: Malachy Moan here for annual physical exam.   Annual physical exam  Essential hypertension - Plan: amLODipine-benazepril (LOTREL) 5-20 MG capsule, CMP14+EGFR, CMP14+EGFR  Pure hypercholesterolemia - Plan: rosuvastatin (CRESTOR) 10 MG tablet, Lipid panel, CMP14+EGFR, TSH, TSH, CMP14+EGFR, Lipid panel  Tobacco use - Plan: CBC, CT CHEST LUNG CA SCREEN LOW DOSE W/O CM, CBC  GAD (generalized anxiety disorder) - Plan: busPIRone (BUSPAR) 15 MG tablet, sertraline (ZOLOFT) 100 MG tablet  History of nephrolithiasis - Plan: tamsulosin (FLOMAX) 0.4 MG CAPS capsule, CMP14+EGFR, Ambulatory referral to Urology, CMP14+EGFR  Screening for malignant neoplasm of prostate - Plan: PSA, PSA  Chronic fatigue - Plan: T4, Free, Vitamin B12, Testosterone, Vitamin B12, T4, Free  CT lung cancer screening ordered today.  I discussed with his wife that the CT renal study that was performed last fall only got the bottom of his lungs and do not fully scan the entire lung fields to look for evidence of cancer or precancerous lesions.   Blood pressure is at goal.  No changes.  Medications have been renewed  Fasting labs ordered and patient will return at a different time to have these  collected.  Of course counseled him on tobacco  cessation.  He so far has been unsuccessful with totally coming off of cigarettes but has reduced it quite a bit and continues to work on eliminating cigarettes  We will continue BuSpar and Zoloft at current doses as patient subjectively feels well.  However, his screening is positive still today and slightly higher than last check.  Not sure if this is due to energy or what but we are going to check his testosterone levels.  If these are unremarkable, I would recommend either increasing his BuSpar or switching his Zoloft to an alternative.  He was amenable to this plan  I have placed a new referral to urology for this gentleman and renewed his Flomax for nephrolithiasis   Counseled on healthy lifestyle choices, including diet (rich in fruits, vegetables and lean meats and low in salt and simple carbohydrates) and exercise (at least 30 minutes of moderate physical activity daily).  Patient to follow up in 1 year for annual exam or sooner if needed.  Shermon Bozzi M. Lajuana Ripple, DO

## 2022-09-21 NOTE — Telephone Encounter (Signed)
He had a CT renal stone study which did not fully evaluate the lungs.  This only captures the very lower lung which did show multiple pulmonary nodules.  Recommend full examination of the lungs with low-dose CT

## 2022-09-21 NOTE — Patient Instructions (Signed)
Fatigue If you have fatigue, you feel tired all the time and have a lack of energy or a lack of motivation. Fatigue may make it difficult to start or complete tasks because of exhaustion. Occasional or mild fatigue is often a normal response to activity or life. However, long-term (chronic) or extreme fatigue may be a symptom of a medical condition such as: Depression. Not having enough red blood cells or hemoglobin in the blood (anemia). A problem with a small gland located in the lower front part of the neck (thyroid disorder). Rheumatologic conditions. These are problems related to the body's defense system (immune system). Infections, especially certain viral infections. Fatigue can also lead to negative health outcomes over time. Follow these instructions at home: Medicines Take over-the-counter and prescription medicines only as told by your health care provider. Take a multivitamin if told by your health care provider. Do not use herbal or dietary supplements unless they are approved by your health care provider. Eating and drinking  Avoid heavy meals in the evening. Eat a well-balanced diet, which includes lean proteins, whole grains, plenty of fruits and vegetables, and low-fat dairy products. Avoid eating or drinking too many products with caffeine in them. Avoid alcohol. Drink enough fluid to keep your urine pale yellow. Activity  Exercise regularly, as told by your health care provider. Use or practice techniques to help you relax, such as yoga, tai chi, meditation, or massage therapy. Lifestyle Change situations that cause you stress. Try to keep your work and personal schedules in balance. Do not use recreational or illegal drugs. General instructions Monitor your fatigue for any changes. Go to bed and get up at the same time every day. Avoid fatigue by pacing yourself during the day and getting enough sleep at night. Maintain a healthy weight. Contact a health care  provider if: Your fatigue does not get better. You have a fever. You suddenly lose or gain weight. You have headaches. You have trouble falling asleep or sleeping through the night. You feel angry, guilty, anxious, or sad. You have swelling in your legs or another part of your body. Get help right away if: You feel confused, feel like you might faint, or faint. Your vision is blurry or you have a severe headache. You have severe pain in your abdomen, your back, or the area between your waist and hips (pelvis). You have chest pain, shortness of breath, or an irregular or fast heartbeat. You are unable to urinate, or you urinate less than normal. You have abnormal bleeding from the rectum, nose, lungs, nipples, or, if you are male, the vagina. You vomit blood. You have thoughts about hurting yourself or others. These symptoms may be an emergency. Get help right away. Call 911. Do not wait to see if the symptoms will go away. Do not drive yourself to the hospital. Get help right away if you feel like you may hurt yourself or others, or have thoughts about taking your own life. Go to your nearest emergency room or: Call 911. Call the National Suicide Prevention Lifeline at 1-800-273-8255 or 988. This is open 24 hours a day. Text the Crisis Text Line at 741741. Summary If you have fatigue, you feel tired all the time and have a lack of energy or a lack of motivation. Fatigue may make it difficult to start or complete tasks because of exhaustion. Long-term (chronic) or extreme fatigue may be a symptom of a medical condition. Exercise regularly, as told by your health care provider.   Change situations that cause you stress. Try to keep your work and personal schedules in balance. This information is not intended to replace advice given to you by your health care provider. Make sure you discuss any questions you have with your health care provider. Document Revised: 04/17/2021 Document  Reviewed: 04/17/2021 Elsevier Patient Education  2023 [employer]  

## 2022-09-22 LAB — LIPID PANEL
Chol/HDL Ratio: 3.1 ratio (ref 0.0–5.0)
Cholesterol, Total: 123 mg/dL (ref 100–199)
HDL: 40 mg/dL (ref >39)
LDL Chol Calc (NIH): 58 mg/dL (ref 0–99)
Triglycerides: 145 mg/dL (ref 0–149)
VLDL Cholesterol Cal: 25 mg/dL (ref 5–40)

## 2022-09-22 LAB — CMP14+EGFR
ALT: 18 IU/L (ref 0–44)
AST: 19 IU/L (ref 0–40)
Albumin/Globulin Ratio: 1.8 (ref 1.2–2.2)
Albumin: 3.9 g/dL (ref 3.8–4.9)
Alkaline Phosphatase: 85 IU/L (ref 44–121)
BUN/Creatinine Ratio: 13 (ref 9–20)
BUN: 11 mg/dL (ref 6–24)
Bilirubin Total: 0.2 mg/dL (ref 0.0–1.2)
CO2: 26 mmol/L (ref 20–29)
Calcium: 8.8 mg/dL (ref 8.7–10.2)
Chloride: 105 mmol/L (ref 96–106)
Creatinine, Ser: 0.82 mg/dL (ref 0.76–1.27)
Globulin, Total: 2.2 g/dL (ref 1.5–4.5)
Glucose: 86 mg/dL (ref 70–99)
Potassium: 4.4 mmol/L (ref 3.5–5.2)
Sodium: 141 mmol/L (ref 134–144)
Total Protein: 6.1 g/dL (ref 6.0–8.5)
eGFR: 102 mL/min/{1.73_m2} (ref >59)

## 2022-09-22 LAB — T4, FREE: Free T4: 0.87 ng/dL (ref 0.82–1.77)

## 2022-09-22 LAB — CBC
Hematocrit: 44.3 % (ref 37.5–51.0)
Hemoglobin: 15.1 g/dL (ref 13.0–17.7)
MCH: 29.6 pg (ref 26.6–33.0)
MCHC: 34.1 g/dL (ref 31.5–35.7)
MCV: 87 fL (ref 79–97)
Platelets: 233 10*3/uL (ref 150–450)
RBC: 5.1 x10E6/uL (ref 4.14–5.80)
RDW: 13.1 % (ref 11.6–15.4)
WBC: 7.4 10*3/uL (ref 3.4–10.8)

## 2022-09-22 LAB — TSH: TSH: 1.59 u[IU]/mL (ref 0.450–4.500)

## 2022-09-22 LAB — PSA: Prostate Specific Ag, Serum: 1.2 ng/mL (ref 0.0–4.0)

## 2022-09-22 LAB — VITAMIN B12: Vitamin B-12: 259 pg/mL (ref 232–1245)

## 2022-10-09 ENCOUNTER — Encounter: Payer: Self-pay | Admitting: Family Medicine

## 2022-10-16 ENCOUNTER — Encounter: Payer: Self-pay | Admitting: Family Medicine

## 2022-10-19 ENCOUNTER — Other Ambulatory Visit: Payer: BC Managed Care – PPO

## 2023-03-28 ENCOUNTER — Ambulatory Visit: Payer: BC Managed Care – PPO | Admitting: Family Medicine

## 2023-03-28 ENCOUNTER — Encounter: Payer: Self-pay | Admitting: Family Medicine

## 2023-03-28 VITALS — BP 147/92 | HR 78 | Temp 98.2°F | Ht 71.0 in | Wt 173.0 lb

## 2023-03-28 DIAGNOSIS — G5701 Lesion of sciatic nerve, right lower limb: Secondary | ICD-10-CM

## 2023-03-28 DIAGNOSIS — M1711 Unilateral primary osteoarthritis, right knee: Secondary | ICD-10-CM

## 2023-03-28 MED ORDER — METHYLPREDNISOLONE ACETATE 80 MG/ML IJ SUSP
80.0000 mg | Freq: Once | INTRAMUSCULAR | Status: AC
  Administered 2023-03-28: 80 mg via INTRAMUSCULAR

## 2023-03-28 NOTE — Progress Notes (Signed)
BP (!) 147/92   Pulse 78   Temp 98.2 F (36.8 C) (Temporal)   Ht 5\' 11"  (1.803 m)   Wt 173 lb (78.5 kg)   SpO2 96%   BMI 24.13 kg/m    Subjective:   Patient ID: [name] [name], male    DOB: Feb 11, 1966, 57 y.o.   MRN: [id number]  HPI: [name] [name] is a 57 y.o. male presenting on 03/28/2023 for Leg Pain (Both legs and hips hurting mainly right leg and knee. Knee pain for last month but for the past week everything has gotten worse. Has hx back and knee injuries)   HPI Right knee pain and now shooting pain down his leg. Patient comes in complaining of right knee pain that is been bothering him for quite some time and has been constant and been going on for months but he said just over the past few days he started getting the shooting pain that comes down from his right lower back all the way down his leg and hurts on top of his foot.  He does have a history of some bulging disks in the past and has had some injections to help with but had not had any problems with anything in his back like this until just recently.  He denies any numbness or weakness but just has that shooting pain going all the way down his leg to the top of his foot.  He denies any trauma specifically that he knows of.  He has been using aspirin and Biofreeze and they do help a lot.  Relevant past medical, surgical, family and social history reviewed and updated as indicated. Interim medical history since our last visit reviewed. Allergies and medications reviewed and updated.  Review of Systems  Constitutional:  Negative for chills and fever.  Eyes:  Negative for visual disturbance.  Respiratory:  Negative for shortness of breath and wheezing.   Cardiovascular:  Negative for chest pain and leg swelling.  Musculoskeletal:  Positive for arthralgias, back pain and myalgias. Negative for gait problem.  Skin:  Negative for rash.  All other systems reviewed and are negative.   Per HPI unless specifically indicated  above   Allergies as of 03/28/2023   No Known Allergies      Medication List        Accurate as of March 28, 2023 10:45 AM. If you have any questions, ask your nurse or doctor.          amLODipine-benazepril 5-20 MG capsule Commonly known as: Lotrel Take 1 capsule by mouth daily.   busPIRone 15 MG tablet Commonly known as: BUSPAR Take 1 tablet (15 mg total) by mouth 2 (two) times daily.   hydrOXYzine 50 MG tablet Commonly known as: ATARAX TAKE 1.5-2 TABLETS (75-100 MG TOTAL) BY MOUTH AT BEDTIME AS NEEDED FOR ANXIETY (SLEEP).   rosuvastatin 10 MG tablet Commonly known as: CRESTOR Take 1 tablet (10 mg total) by mouth daily.   sertraline 100 MG tablet Commonly known as: ZOLOFT Take 1 tablet (100 mg total) by mouth daily.   tamsulosin 0.4 MG Caps capsule Commonly known as: FLOMAX Take 1 capsule (0.4 mg total) by mouth daily.   VITAMIN C ADULT GUMMIES PO Take by mouth once a week. Take once a week         Objective:   BP (!) 147/92   Pulse 78   Temp 98.2 F (36.8 C) (Temporal)   Ht 5\' 11"  (1.803 m)   Wt  173 lb (78.5 kg)   SpO2 96%   BMI 24.13 kg/m   Wt Readings from Last 3 Encounters:  03/28/23 173 lb (78.5 kg)  09/21/22 181 lb (82.1 kg)  02/06/22 180 lb (81.6 kg)    Physical Exam Vitals and nursing note reviewed.  Constitutional:      Appearance: Normal appearance.  Musculoskeletal:     Lumbar back: Tenderness present. No bony tenderness. Normal range of motion. Negative right straight leg raise test and negative left straight leg raise test.       Back:     Right knee: No bony tenderness or crepitus. Tenderness present over the medial joint line. No LCL laxity, MCL laxity, ACL laxity or PCL laxity. Abnormal meniscus. Normal alignment.  Neurological:     Mental Status: He is alert.       Assessment & Plan:   Problem List Items Addressed This Visit   None Visit Diagnoses     Neuropathy of right sciatic nerve    -  Primary    Relevant Medications   methylPREDNISolone acetate (DEPO-MEDROL) injection 80 mg   Primary osteoarthritis of right knee       Relevant Medications   methylPREDNISolone acetate (DEPO-MEDROL) injection 80 mg     Will give Depo-Medrol intramuscular injection to help calm the nerves down, it sounds like the knee is likely arthritis and likely needs to be assessed in the future. The injection today should help a little bit with the knee as well Follow up plan: Return if symptoms worsen or fail to improve.  Counseling provided for all of the vaccine components No orders of the defined types were placed in this encounter.   Arville Care, MD [facility] 03/28/2023, 10:45 AM

## 2023-07-13 DIAGNOSIS — M25511 Pain in right shoulder: Secondary | ICD-10-CM | POA: Diagnosis not present

## 2023-08-06 DIAGNOSIS — M25511 Pain in right shoulder: Secondary | ICD-10-CM | POA: Diagnosis not present

## 2023-09-13 ENCOUNTER — Other Ambulatory Visit: Payer: Self-pay | Admitting: Family Medicine

## 2023-09-13 DIAGNOSIS — I10 Essential (primary) hypertension: Secondary | ICD-10-CM

## 2023-09-13 DIAGNOSIS — F411 Generalized anxiety disorder: Secondary | ICD-10-CM

## 2023-10-08 ENCOUNTER — Other Ambulatory Visit: Payer: Self-pay | Admitting: Family Medicine

## 2023-10-08 ENCOUNTER — Encounter: Payer: Self-pay | Admitting: Family Medicine

## 2023-10-08 DIAGNOSIS — I10 Essential (primary) hypertension: Secondary | ICD-10-CM

## 2023-10-08 DIAGNOSIS — F411 Generalized anxiety disorder: Secondary | ICD-10-CM

## 2023-10-08 NOTE — Telephone Encounter (Signed)
 Left message for pt to schedule appt for med refill and will mail a letter

## 2023-10-08 NOTE — Telephone Encounter (Signed)
 gottschalk pt NTBS 30-d given 09/13/23

## 2023-10-09 ENCOUNTER — Encounter: Payer: Self-pay | Admitting: Family Medicine

## 2023-10-09 NOTE — Telephone Encounter (Signed)
 LMTCB TO SCHEDULE APPT LETTER MAILED

## 2023-10-09 NOTE — Telephone Encounter (Signed)
 Gottschalk pt NTBS 30-d given 09/13/23

## 2023-10-12 ENCOUNTER — Other Ambulatory Visit: Payer: Self-pay | Admitting: Family Medicine

## 2023-10-12 DIAGNOSIS — F411 Generalized anxiety disorder: Secondary | ICD-10-CM

## 2023-10-12 DIAGNOSIS — I10 Essential (primary) hypertension: Secondary | ICD-10-CM

## 2023-11-28 ENCOUNTER — Other Ambulatory Visit: Payer: Self-pay | Admitting: Family Medicine

## 2023-11-28 DIAGNOSIS — Z87442 Personal history of urinary calculi: Secondary | ICD-10-CM

## 2024-01-01 ENCOUNTER — Other Ambulatory Visit: Payer: Self-pay | Admitting: Family Medicine

## 2024-01-01 DIAGNOSIS — Z87442 Personal history of urinary calculi: Secondary | ICD-10-CM

## 2024-01-09 ENCOUNTER — Emergency Department (HOSPITAL_BASED_OUTPATIENT_CLINIC_OR_DEPARTMENT_OTHER)
Admission: EM | Admit: 2024-01-09 | Discharge: 2024-01-09 | Disposition: A | Discharge status: Home / Self Care | Attending: Emergency Medicine | Admitting: Emergency Medicine

## 2024-01-09 ENCOUNTER — Emergency Department (HOSPITAL_BASED_OUTPATIENT_CLINIC_OR_DEPARTMENT_OTHER)

## 2024-01-09 ENCOUNTER — Encounter (HOSPITAL_BASED_OUTPATIENT_CLINIC_OR_DEPARTMENT_OTHER): Payer: Self-pay | Admitting: Emergency Medicine

## 2024-01-09 ENCOUNTER — Other Ambulatory Visit: Payer: Self-pay

## 2024-01-09 DIAGNOSIS — Z79899 Other long term (current) drug therapy: Secondary | ICD-10-CM | POA: Insufficient documentation

## 2024-01-09 DIAGNOSIS — F17200 Nicotine dependence, unspecified, uncomplicated: Secondary | ICD-10-CM | POA: Diagnosis not present

## 2024-01-09 DIAGNOSIS — N281 Cyst of kidney, acquired: Secondary | ICD-10-CM

## 2024-01-09 DIAGNOSIS — R1011 Right upper quadrant pain: Secondary | ICD-10-CM

## 2024-01-09 DIAGNOSIS — N2889 Other specified disorders of kidney and ureter: Secondary | ICD-10-CM | POA: Diagnosis not present

## 2024-01-09 DIAGNOSIS — R109 Unspecified abdominal pain: Secondary | ICD-10-CM | POA: Diagnosis not present

## 2024-01-09 DIAGNOSIS — N200 Calculus of kidney: Secondary | ICD-10-CM | POA: Insufficient documentation

## 2024-01-09 LAB — CBC WITH DIFFERENTIAL/PLATELET
Abs Immature Granulocytes: 0.01 10*3/uL (ref 0.00–0.07)
Basophils Absolute: 0 10*3/uL (ref 0.0–0.1)
Basophils Relative: 0 %
Eosinophils Absolute: 0.2 10*3/uL (ref 0.0–0.5)
Eosinophils Relative: 2 %
HCT: 43.9 % (ref 39.0–52.0)
Hemoglobin: 14.8 g/dL (ref 13.0–17.0)
Immature Granulocytes: 0 %
Lymphocytes Relative: 42 %
Lymphs Abs: 3.1 10*3/uL (ref 0.7–4.0)
MCH: 29.3 pg (ref 26.0–34.0)
MCHC: 33.7 g/dL (ref 30.0–36.0)
MCV: 86.9 fL (ref 80.0–100.0)
Monocytes Absolute: 0.8 10*3/uL (ref 0.1–1.0)
Monocytes Relative: 11 %
Neutro Abs: 3.2 10*3/uL (ref 1.7–7.7)
Neutrophils Relative %: 45 %
Platelets: 231 10*3/uL (ref 150–400)
RBC: 5.05 MIL/uL (ref 4.22–5.81)
RDW: 13.4 % (ref 11.5–15.5)
WBC: 7.2 10*3/uL (ref 4.0–10.5)
nRBC: 0 % (ref 0.0–0.2)

## 2024-01-09 LAB — URINALYSIS, ROUTINE W REFLEX MICROSCOPIC
Bilirubin Urine: NEGATIVE
Glucose, UA: NEGATIVE mg/dL
Hgb urine dipstick: NEGATIVE
Ketones, ur: NEGATIVE mg/dL
Leukocytes,Ua: NEGATIVE
Nitrite: NEGATIVE
Protein, ur: NEGATIVE mg/dL
Specific Gravity, Urine: 1.011 (ref 1.005–1.030)
pH: 6 (ref 5.0–8.0)

## 2024-01-09 LAB — COMPREHENSIVE METABOLIC PANEL WITH GFR
ALT: 12 U/L (ref 0–44)
AST: 22 U/L (ref 15–41)
Albumin: 4 g/dL (ref 3.5–5.0)
Alkaline Phosphatase: 103 U/L (ref 38–126)
Anion gap: 10 (ref 5–15)
BUN: 12 mg/dL (ref 6–20)
CO2: 22 mmol/L (ref 22–32)
Calcium: 9 mg/dL (ref 8.9–10.3)
Chloride: 105 mmol/L (ref 98–111)
Creatinine, Ser: 0.91 mg/dL (ref 0.61–1.24)
GFR, Estimated: >60 mL/min (ref >60)
Glucose, Bld: 110 mg/dL — ABNORMAL HIGH (ref 70–99)
Potassium: 4.3 mmol/L (ref 3.5–5.1)
Sodium: 136 mmol/L (ref 135–145)
Total Bilirubin: 0.4 mg/dL (ref 0.0–1.2)
Total Protein: 6.8 g/dL (ref 6.5–8.1)

## 2024-01-09 LAB — LIPASE, BLOOD: Lipase: 35 U/L (ref 11–51)

## 2024-01-09 MED ORDER — MORPHINE SULFATE (PF) 4 MG/ML IV SOLN
4.0000 mg | Freq: Once | INTRAVENOUS | Status: AC
  Administered 2024-01-09: 4 mg via INTRAVENOUS
  Filled 2024-01-09: qty 1

## 2024-01-09 MED ORDER — KETOROLAC TROMETHAMINE 15 MG/ML IJ SOLN
15.0000 mg | Freq: Once | INTRAMUSCULAR | Status: AC
  Administered 2024-01-09: 15 mg via INTRAVENOUS
  Filled 2024-01-09: qty 1

## 2024-01-09 MED ORDER — ONDANSETRON HCL 4 MG PO TABS: 4.0000 mg | ORAL_TABLET | ORAL | 0 refills | Status: AC | PRN

## 2024-01-09 MED ORDER — ACETAMINOPHEN 325 MG PO TABS
650.0000 mg | ORAL_TABLET | Freq: Four times a day (QID) | ORAL | 0 refills | Status: AC | PRN
Start: 2024-01-09 — End: ?

## 2024-01-09 MED ORDER — SODIUM CHLORIDE 0.9 % IV BOLUS
1000.0000 mL | Freq: Once | INTRAVENOUS | Status: AC
  Administered 2024-01-09: 1000 mL via INTRAVENOUS

## 2024-01-09 MED ORDER — HYDROCODONE-ACETAMINOPHEN 5-325 MG PO TABS
1.0000 | ORAL_TABLET | Freq: Once | ORAL | Status: AC
  Administered 2024-01-09: 1 via ORAL
  Filled 2024-01-09: qty 1

## 2024-01-09 MED ORDER — OXYCODONE HCL 5 MG PO TABS: 5.0000 mg | ORAL_TABLET | Freq: Four times a day (QID) | ORAL | 0 refills | Status: AC | PRN

## 2024-01-09 MED ORDER — IOHEXOL 300 MG/ML  SOLN
100.0000 mL | Freq: Once | INTRAMUSCULAR | Status: AC | PRN
  Administered 2024-01-09: 100 mL via INTRAVENOUS

## 2024-01-09 MED ORDER — ONDANSETRON HCL 4 MG/2ML IJ SOLN
4.0000 mg | Freq: Once | INTRAMUSCULAR | Status: AC
  Administered 2024-01-09: 4 mg via INTRAVENOUS
  Filled 2024-01-09: qty 2

## 2024-01-09 NOTE — ED Provider Notes (Signed)
 [place] [facility] DEPARTMENT AT [facility] Provider Note   CSN: [id number] Arrival date & time: 01/09/24  1601     Patient presents with: Flank Pain   [name] [name] is a 58 y.o. male.   Patient presents in obvious pain. [name] [name] is bent over on the best holding his right side. [name] [name] has had several kidney stones in the past. This episode started three weeks ago and [name] [name] has an appt with PCP scheduled for Monday but could not wait any longer so [name] [name] came to the ED. Patient says it started in his right back but has now extended around his chest on both sides and is worse on the right chest. [name] [name] has not noticed any blood in the urine although it is a darker brown. [name] [name] is able to get his urine out freely. [name] [name] denies itching or burning when peeing. [name] [name] denies blood in the stool and denies dizziness. [name] [name] does say [name] [name] feels short of breath because it is difficult for him to breath deeply.    The only thing [name] [name]'s tried is aspirin and tylenol .   The history is provided by the patient and the spouse.  Flank Pain This is a new problem. The current episode started more than 1 week ago. The problem has been gradually worsening. Associated symptoms include chest pain, abdominal pain and shortness of breath. Associated symptoms comments: SOB due to pain on deep breath. [name] [name] has tried ASA and acetaminophen  for the symptoms.       Prior to Admission medications   Medication Sig Start Date End Date Taking? Authorizing Provider  amLODipine -benazepril  (LOTREL) 5-20 MG capsule Take 1 capsule by mouth daily. **NEEDS TO BE SEEN BEFORE NEXT REFILL** 09/13/23   Jolinda Potter M, DO  Ascorbic Acid (VITAMIN C ADULT GUMMIES PO) Take by mouth once a week. Take once a week    [provider]  busPIRone  (BUSPAR ) 15 MG tablet Take 1 tablet (15 mg total) by mouth 2 (two) times daily. **NEEDS TO BE SEEN BEFORE NEXT REFILL** 09/13/23   Jolinda Potter M, DO  hydrOXYzine  (ATARAX ) 50 MG tablet TAKE 1.5-2 TABLETS  (75-100 MG TOTAL) BY MOUTH AT BEDTIME AS NEEDED FOR ANXIETY (SLEEP). 08/15/21   Jolinda Potter HERO, DO  rosuvastatin  (CRESTOR ) 10 MG tablet Take 1 tablet (10 mg total) by mouth daily. 09/21/22   Jolinda Potter HERO, DO  sertraline  (ZOLOFT ) 100 MG tablet Take 1 tablet (100 mg total) by mouth daily. **NEEDS TO BE SEEN BEFORE NEXT REFILL** 09/13/23   Jolinda Potter M, DO  tamsulosin  (FLOMAX ) 0.4 MG CAPS capsule Take 1 capsule (0.4 mg total) by mouth daily. 01/01/24   Jolinda Potter HERO, DO    Allergies: Patient has no known allergies.    Review of Systems  Respiratory:  Positive for shortness of breath.   Cardiovascular:  Positive for chest pain.  Gastrointestinal:  Positive for abdominal pain.  Genitourinary:  Positive for flank pain.    Updated Vital Signs BP (!) 170/112 (BP Location: Left Arm)   Pulse 64   Temp 98.5 F (36.9 C)   Resp (!) 22   SpO2 99%   Physical Exam HENT:     Head: Normocephalic.  Abdominal:     Tenderness: There is abdominal tenderness.     Comments: Guarding on right side   Musculoskeletal:     Comments: Positive CVA tenderness on the right Pain on palpation along right thoracic   Skin:    Comments: No vesicles or pustules on  chest, back or abdomen   Neurological:     Mental Status: [name] [name] is alert.     (all labs ordered are listed, but only abnormal results are displayed) Labs Reviewed  URINALYSIS, ROUTINE W REFLEX MICROSCOPIC  CBC WITH DIFFERENTIAL/PLATELET  COMPREHENSIVE METABOLIC PANEL WITH GFR  LIPASE, BLOOD    EKG: None  Radiology: No results found.   Procedures   Medications Ordered in the ED - No data to display  Clinical Course as of 01/09/24 2039  Thu Jan 09, 2024  1903 CT ABDOMEN PELVIS W CONTRAST [EL]  1924 Fluid density noted to left kidney, o/p MRI [SG]  2030 Feeling better, ambulatory, tolerating po [SG]    Clinical Course User Index [EL] Charmayne Holmes, DO [SG] Elnor Jayson LABOR, DO                                  Medical Decision Making Amount and/or Complexity of Data Reviewed Labs: ordered. Radiology: ordered. Decision-making details documented in ED Course.  Risk OTC drugs. Prescription drug management.   This patient presents to the ED for concern of right back, side, and chest pain, this involves an extensive number of treatment options, and is a complaint that carries with it a high risk of complications and morbidity.  The differential diagnosis includes urolithiasis, pyelonephritis, pancreatitis, myocardial infarction, aortic dissection, aortic aneurysm, gastric ulcer perforation, hepatitis, cholecystitis Imaging revealed a stone. The patient was given pain medication in ED and sent home with pain medications.  White count and UA normal ruling out infectious cause such as nephritis cholecystitis or UTI.  Lipase normal making pancreatitis less likely.  EKG normal sinus making MI less likely.  CT negative for aneurysm.    Co morbidities / Chronic conditions that complicate the patient evaluation  Chronic tobacco BPH   Additional history obtained:  Additional history obtained from EMR External records from outside source obtained and reviewed including history of nephrolithiasis    Lab Tests:  I Ordered, and personally interpreted labs.  The pertinent results include:   CBC: normal 7.2 WBC and 5.05 RBC UA: normal, negative ketones nitrites and leukocytes  Lipase: normal 35   Imaging Studies ordered:  I ordered imaging studies including CT abdomen with contrast  I independently visualized and interpreted imaging which showed stone in right kidney,  I agree with the radiologist interpretation   Cardiac Monitoring: / EKG:  The patient was maintained on a cardiac monitor.  I personally viewed and interpreted the cardiac monitored which showed an underlying rhythm of: normal sinus   Problem List / ED Course / Critical interventions / Medication management  Right  nephrolithiasis  I ordered medication including morphine , ondansetron , and ketorolac  for pain.    Reevaluation of the patient after these medicines showed that the patient was feeling better.  I have reviewed the patients home medicines and have made adjustments as needed At home pain management includes acetaminophen , ondansetron , and oxycodone .    Social Determinants of Health:  With wife and nephew.       Final diagnoses:  None    ED Discharge Orders     None          Charmayne Holmes, DO 01/09/24 2051    Elnor Jayson A, DO 01/10/24 1603

## 2024-01-09 NOTE — Discharge Instructions (Addendum)
 It was a pleasure caring for you today in the emergency department.  Please see your PCP on Monday.  In interim please drink plenty of fluids, get plenty rest.  Eat a very bland diet. Follow up with your PCP for MRI of you kidney, this can be done outpatient   Please return to the emergency department for any worsening or worrisome symptoms.

## 2024-01-09 NOTE — ED Triage Notes (Signed)
 Right sided flank pain into groin. Painful urination. Started today. Hx renal stones.

## 2024-01-13 ENCOUNTER — Encounter: Payer: Self-pay | Admitting: Family Medicine

## 2024-01-13 ENCOUNTER — Ambulatory Visit (INDEPENDENT_AMBULATORY_CARE_PROVIDER_SITE_OTHER): Admitting: Family Medicine

## 2024-01-13 VITALS — BP 138/81 | HR 54 | Temp 98.0°F | Ht 71.0 in | Wt 160.0 lb

## 2024-01-13 DIAGNOSIS — Z23 Encounter for immunization: Secondary | ICD-10-CM | POA: Diagnosis not present

## 2024-01-13 DIAGNOSIS — E7800 Pure hypercholesterolemia, unspecified: Secondary | ICD-10-CM | POA: Diagnosis not present

## 2024-01-13 DIAGNOSIS — R93422 Abnormal radiologic findings on diagnostic imaging of left kidney: Secondary | ICD-10-CM

## 2024-01-13 DIAGNOSIS — Z720 Tobacco use: Secondary | ICD-10-CM

## 2024-01-13 DIAGNOSIS — Z87442 Personal history of urinary calculi: Secondary | ICD-10-CM

## 2024-01-13 DIAGNOSIS — R634 Abnormal weight loss: Secondary | ICD-10-CM | POA: Diagnosis not present

## 2024-01-13 DIAGNOSIS — I10 Essential (primary) hypertension: Secondary | ICD-10-CM

## 2024-01-13 DIAGNOSIS — F411 Generalized anxiety disorder: Secondary | ICD-10-CM | POA: Diagnosis not present

## 2024-01-13 DIAGNOSIS — Z125 Encounter for screening for malignant neoplasm of prostate: Secondary | ICD-10-CM

## 2024-01-13 DIAGNOSIS — N2889 Other specified disorders of kidney and ureter: Secondary | ICD-10-CM

## 2024-01-13 MED ORDER — SERTRALINE HCL 100 MG PO TABS: 100.0000 mg | ORAL_TABLET | Freq: Every day | ORAL | 4 refills | Status: AC

## 2024-01-13 MED ORDER — BUSPIRONE HCL 15 MG PO TABS: 15.0000 mg | ORAL_TABLET | Freq: Two times a day (BID) | ORAL | 4 refills | Status: AC

## 2024-01-13 MED ORDER — ROSUVASTATIN CALCIUM 10 MG PO TABS: 10.0000 mg | ORAL_TABLET | Freq: Every day | ORAL | 4 refills | Status: AC

## 2024-01-13 MED ORDER — AMLODIPINE BESY-BENAZEPRIL HCL 5-20 MG PO CAPS
1.0000 | ORAL_CAPSULE | Freq: Every day | ORAL | 4 refills | Status: AC
Start: 2024-01-13 — End: ?

## 2024-01-13 MED ORDER — TAMSULOSIN HCL 0.4 MG PO CAPS: 0.4000 mg | ORAL_CAPSULE | Freq: Every day | ORAL | 4 refills | Status: AC

## 2024-01-13 NOTE — Progress Notes (Signed)
 Subjective: CC: ER follow-up, med refills PCP: Jolinda Norene HERO, DO YEP:[name] [name] is a 58 y.o. male presenting to clinic today for:  1.  Left lower quadrant abdominal pain Patient was seen in the ER for right upper quadrant abdominal pain but actually reports left lower quadrant abdominal pain today.  He had imaging done of the abdomen which demonstrated no evidence of bladder stone or renal stone.  It did show some right urothelial thickening and a nonobstructive 5 mm right renal stone.  Incidentally there was an interval increase in the size of a fluid density on the left kidney associated with a calcification and that was recommended that he have follow-up with MRI renal protocol.  He denies any blood in stool.  He does report unplanned weight loss over the last year, 20 pounds.  He reports loss of appetite.  Had colonoscopy performed back in 2023 and that showed tubular adenoma.  He continues to smoke regularly.  Denies any hemoptysis, but has had night sweats for years.  No known thyroid  disorders in the family.  Denies any changes in medications.  No use of supplements.  No real changes in diet except for decreased p.o. intake due to loss of appetite.  He denies any substance use except for tobacco.  2.  Hypertension with hyperlipidemia Patient is compliant with medications but needs refills.  His blood pressure was really elevated in the ER but he was actively in pain as well.  He denies any chest pain, shortness of breath or edema.  He reports unplanned weight loss as above.   ROS: Per HPI  No Known Allergies Past Medical History:  Diagnosis Date   Anxiety    Bulging discs    Chronic kidney disease    kidney stones   Depression    Hyperlipidemia    Hypertension    Irregular heart beat    25 years ago   Kidney stones     Current Outpatient Medications:    acetaminophen  (TYLENOL ) 325 MG tablet, Take 2 tablets (650 mg total) by mouth every 6 (six) hours as needed.,  Disp: 36 tablet, Rfl: 0   amLODipine -benazepril  (LOTREL) 5-20 MG capsule, Take 1 capsule by mouth daily. **NEEDS TO BE SEEN BEFORE NEXT REFILL**, Disp: 30 capsule, Rfl: 0   Ascorbic Acid (VITAMIN C ADULT GUMMIES PO), Take by mouth once a week. Take once a week, Disp: , Rfl:    busPIRone  (BUSPAR ) 15 MG tablet, Take 1 tablet (15 mg total) by mouth 2 (two) times daily. **NEEDS TO BE SEEN BEFORE NEXT REFILL**, Disp: 60 tablet, Rfl: 0   hydrOXYzine  (ATARAX ) 50 MG tablet, TAKE 1.5-2 TABLETS (75-100 MG TOTAL) BY MOUTH AT BEDTIME AS NEEDED FOR ANXIETY (SLEEP)., Disp: 180 tablet, Rfl: 3   ondansetron  (ZOFRAN ) 4 MG tablet, Take 1 tablet (4 mg total) by mouth every 4 (four) hours as needed for nausea or vomiting., Disp: 12 tablet, Rfl: 0   oxyCODONE  (ROXICODONE ) 5 MG immediate release tablet, Take 1 tablet (5 mg total) by mouth every 6 (six) hours as needed., Disp: 15 tablet, Rfl: 0   rosuvastatin  (CRESTOR ) 10 MG tablet, Take 1 tablet (10 mg total) by mouth daily., Disp: 90 tablet, Rfl: 3   sertraline  (ZOLOFT ) 100 MG tablet, Take 1 tablet (100 mg total) by mouth daily. **NEEDS TO BE SEEN BEFORE NEXT REFILL**, Disp: 30 tablet, Rfl: 0   tamsulosin  (FLOMAX ) 0.4 MG CAPS capsule, Take 1 capsule (0.4 mg total) by mouth daily., Disp: 90  capsule, Rfl: 0 Social History   Socioeconomic History   Marital status: Married    Spouse name: Not on file   Number of children: 1   Years of education: Not on file   Highest education level: Not on file  Occupational History   Occupation: Engineer, mining  Tobacco Use   Smoking status: Some Days    Current packs/day: 0.00    Average packs/day: 1 pack/day for 28.5 years (28.5 ttl pk-yrs)    Types: Cigarettes    Start date: 12/31/1992    Last attempt to quit: 07/09/2021    Years since quitting: 2.5    Passive exposure: Never   Smokeless tobacco: Never  Vaping Use   Vaping status: Some Days   Substances: Nicotine  Substance and Sexual Activity   Alcohol use: Yes     Comment: occasional-1-2 drinks every 2 weeks   Drug use: No   Sexual activity: Not Currently  Other Topics Concern   Not on file  Social History Narrative   Not on file   Social Drivers of Health   Financial Resource Strain: Not on file  Food Insecurity: Not on file  Transportation Needs: Not on file  Physical Activity: Not on file  Stress: Not on file  Social Connections: Not on file  Intimate Partner Violence: Not on file   Family History  Problem Relation Age of Onset   Congestive Heart Failure Mother    ALS Father    Heart attack Brother 50   Healthy Son    Colon cancer Neg Hx    Colon polyps Neg Hx    Esophageal cancer Neg Hx    Rectal cancer Neg Hx    Stomach cancer Neg Hx     Objective: Office vital signs reviewed. BP 138/81   Pulse (!) 54   Temp 98 F (36.7 C)   Ht 5' 11 (1.803 m)   Wt 160 lb (72.6 kg)   SpO2 98%   BMI 22.32 kg/m   Physical Examination:  General: Awake, alert, nontoxic-appearing male, No acute distress HEENT: Sclera white.  No thyromegaly.  No goiter.  No exophthalmos.  Mucous membranes are moist. Cardio: Slightly bradycardic with regular rhythm.  S1S2 heard, no murmurs appreciated Pulm: clear to auscultation bilaterally, no wheezes, rhonchi or rales; normal work of breathing on room air GI: soft, left lower quadrant and left upper quadrant tenderness to palpation present.  No rebound or guarding GU: No suprapubic tenderness palpation     01/13/2024    8:53 AM 03/28/2023   10:33 AM 09/21/2022    1:49 PM  Depression screen PHQ 2/9  Decreased Interest 2 0 1  Down, Depressed, Hopeless 2 0 1  PHQ - 2 Score 4 0 2  Altered sleeping 2 2 3   Tired, decreased energy 2 0 1  Change in appetite 2 0 1  Feeling bad or failure about yourself  0 0 1  Trouble concentrating 0 1 3  Moving slowly or fidgety/restless 0 0 1  Suicidal thoughts 0 0 0  PHQ-9 Score 10 3 12   Difficult doing work/chores Somewhat difficult Somewhat difficult        01/13/2024    8:53 AM 03/28/2023   10:34 AM 09/21/2022    1:43 PM 06/19/2021    3:22 PM  GAD 7 : Generalized Anxiety Score  Nervous, Anxious, on Edge 0 1 0 2  Control/stop worrying 1 1 0 3  Worry too much - different things 1 1 2  2  Trouble relaxing 1 1 2 3   Restless 0 0 1 2  Easily annoyed or irritable 3 1 2 2   Afraid - awful might happen 0 1 0 2  Total GAD 7 Score 6 6 7 16   Anxiety Difficulty Somewhat difficult Somewhat difficult  Somewhat difficult    CT ABDOMEN PELVIS W CONTRAST Result Date: 01/09/2024 CLINICAL DATA:  Abdominal pain, acute, nonlocalized Right sided flank pain into groin. Painful urination. Started today. Hx renal stones. EXAM: CT ABDOMEN AND PELVIS WITH CONTRAST TECHNIQUE: Multidetector CT imaging of the abdomen and pelvis was performed using the standard protocol following bolus administration of intravenous contrast. RADIATION DOSE REDUCTION: This exam was performed according to the departmental dose-optimization program which includes automated exposure control, adjustment of the mA and/or kV according to patient size and/or use of iterative reconstruction technique. CONTRAST:  OMNIPAQUE  IOHEXOL  300 MG/ML  SOLN COMPARISON:  CT abdomen pelvis 02/06/2022 FINDINGS: Lower chest: Stable chronic 1 x 0.3 cm and 0.3 cm subpleural right lower lobe pulmonary nodule-no further follow-up indicated. No acute abnormality. Bilateral lower lobe atelectasis. Hepatobiliary: No focal liver abnormality. No gallstones, gallbladder wall thickening, or pericholecystic fluid. No biliary dilatation. Pancreas: No focal lesion. Normal pancreatic contour. No surrounding inflammatory changes. No main pancreatic ductal dilatation. Spleen: Normal in size without focal abnormality. Adrenals/Urinary Tract: No adrenal nodule bilaterally. Bilateral kidneys enhance symmetrically. Right urothelial thickening. Interval increase in size of a 3.4 cm (from 1.6 cm) fluid density lesion within the left kidney with  associated mural 5 mm calcification. Right nephrolithiasis measuring up to 5 mm. No ureterolithiasis. No hydronephrosis. No hydroureter. The urinary bladder is unremarkable. On delayed imaging, there is no urothelial wall thickening and there are no filling defects in the opacified portions of the bilateral collecting systems or ureters. Stomach/Bowel: Stomach is within normal limits. No evidence of bowel wall thickening or dilatation. Appendix appears normal. Vascular/Lymphatic: No abdominal aorta or iliac aneurysm. Severe calcified and noncalcified atherosclerotic plaque of the aorta and its branches. No abdominal, pelvic, or inguinal lymphadenopathy. Reproductive: Prostate is unremarkable. Other: No intraperitoneal free fluid. No intraperitoneal free gas. No organized fluid collection. Musculoskeletal: No abdominal wall hernia or abnormality. No suspicious lytic or blastic osseous lesions. No acute displaced fracture. IMPRESSION: 1. Right urothelial thickening. Correlate with urinalysis for infection. 2. Nonobstructive 5 mm right nephrolithiasis. 3. Interval increase in size of a 3.4 cm (from 1.6 cm) fluid density lesion within the left kidney with associated mural 5 mm calcification. When the patient is clinically stable and able to follow directions and hold their breath (preferably as an outpatient) further evaluation with dedicated MRI renal protocol should be considered. 4.  Aortic Atherosclerosis (ICD10-I70.0). Electronically Signed   By: Morgane  Naveau M.D.   On: 01/09/2024 19:06     Assessment/ Plan: 58 y.o. male   Unexplained weight loss - Plan: CBC with Differential, TSH + free T4, CT Chest Wo Contrast  Tobacco use - Plan: CBC with Differential, CT Chest Wo Contrast  Abnormal finding on diagnostic imaging of left kidney - Plan: MR Abdomen W Wo Contrast  Other specified disorders of kidney and ureter - Plan: MR Abdomen W Wo Contrast  Essential hypertension - Plan: amLODipine -benazepril   (LOTREL) 5-20 MG capsule, CMP14+EGFR  Pure hypercholesterolemia - Plan: rosuvastatin  (CRESTOR ) 10 MG tablet, CMP14+EGFR, Lipid Panel  GAD (generalized anxiety disorder) - Plan: busPIRone  (BUSPAR ) 15 MG tablet, sertraline  (ZOLOFT ) 100 MG tablet, CMP14+EGFR  History of nephrolithiasis - Plan: tamsulosin  (FLOMAX ) 0.4 MG CAPS capsule, CMP14+EGFR  Screening for malignant neoplasm of prostate - Plan: CMP14+EGFR, PSA  Immunization due - Plan: Pneumococcal conjugate vaccine 20-valent (Prevnar 20)  I have ordered ASAP imaging of the lungs to rule out malignancy given ongoing smoking status.  I am going to place order for MRI of the abdomen to further evaluate this left-sided finding, particularly since he is continue to have a left-sided abdominal pain.  I reviewed his ER notes and recommendations.  Labs were collected today to look for metabolic reasons for weight loss.  I would favor him seeing gastroenterology again should imaging be unremarkable, particular given early satiety.  I wonder if he might need an EGD  His blood pressure was controlled here in office so I renewed his medications.  Fasting lipid collected.  Statin renewed  Anxiety meds renewed  Flomax  renewed.  Prostate specific antigen ordered  Pneumococcal vaccination administered  Total time spent with patient 41 minutes.  Greater than 50% of encounter spent in coordination of care/counseling.  Norene CHRISTELLA Fielding, DO [facility] [phone]

## 2024-01-13 NOTE — Patient Instructions (Addendum)
 Ordered a few labs to look at why you are having unplanned weight loss.  I have also ordered a stat CAT scan of your chest to rule out any malignant processes given your long-term smoking.  I think that if the CAT scan is unremarkable we should strongly consider having you see gastroenterology again since you are having some lower abdominal pain. Your CAT scan in the ER did show some interval increase of fluid near one of the kidneys and MRI of that kidney was recommended at some point.  I will also try and get that ordered for you

## 2024-01-14 ENCOUNTER — Telehealth: Payer: Self-pay

## 2024-01-14 ENCOUNTER — Ambulatory Visit: Payer: Self-pay | Admitting: Family Medicine

## 2024-01-14 DIAGNOSIS — E7800 Pure hypercholesterolemia, unspecified: Secondary | ICD-10-CM

## 2024-01-14 DIAGNOSIS — R634 Abnormal weight loss: Secondary | ICD-10-CM

## 2024-01-14 LAB — CMP14+EGFR
ALT: 11 IU/L (ref 0–44)
AST: 15 IU/L (ref 0–40)
Albumin: 3.9 g/dL (ref 3.8–4.9)
Alkaline Phosphatase: 107 IU/L (ref 44–121)
BUN/Creatinine Ratio: 13 (ref 9–20)
BUN: 11 mg/dL (ref 6–24)
Bilirubin Total: 0.4 mg/dL (ref 0.0–1.2)
CO2: 22 mmol/L (ref 20–29)
Calcium: 9 mg/dL (ref 8.7–10.2)
Chloride: 102 mmol/L (ref 96–106)
Creatinine, Ser: 0.83 mg/dL (ref 0.76–1.27)
Globulin, Total: 2.3 g/dL (ref 1.5–4.5)
Glucose: 97 mg/dL (ref 70–99)
Potassium: 5.1 mmol/L (ref 3.5–5.2)
Sodium: 139 mmol/L (ref 134–144)
Total Protein: 6.2 g/dL (ref 6.0–8.5)
eGFR: 101 mL/min/1.73 (ref >59)

## 2024-01-14 LAB — CBC WITH DIFFERENTIAL/PLATELET
Basophils Absolute: 0 x10E3/uL (ref 0.0–0.2)
Basos: 0 %
EOS (ABSOLUTE): 0.3 x10E3/uL (ref 0.0–0.4)
Eos: 5 %
Hematocrit: 47.2 % (ref 37.5–51.0)
Hemoglobin: 15.7 g/dL (ref 13.0–17.7)
Immature Grans (Abs): 0 x10E3/uL (ref 0.0–0.1)
Immature Granulocytes: 0 %
Lymphocytes Absolute: 2.4 x10E3/uL (ref 0.7–3.1)
Lymphs: 40 %
MCH: 29.8 pg (ref 26.6–33.0)
MCHC: 33.3 g/dL (ref 31.5–35.7)
MCV: 90 fL (ref 79–97)
Monocytes Absolute: 0.7 x10E3/uL (ref 0.1–0.9)
Monocytes: 11 %
Neutrophils Absolute: 2.7 x10E3/uL (ref 1.4–7.0)
Neutrophils: 44 %
Platelets: 217 x10E3/uL (ref 150–450)
RBC: 5.26 x10E6/uL (ref 4.14–5.80)
RDW: 13 % (ref 11.6–15.4)
WBC: 6 x10E3/uL (ref 3.4–10.8)

## 2024-01-14 LAB — LIPID PANEL
Chol/HDL Ratio: 3.8 ratio (ref 0.0–5.0)
Cholesterol, Total: 164 mg/dL (ref 100–199)
HDL: 43 mg/dL (ref >39)
LDL Chol Calc (NIH): 101 mg/dL — ABNORMAL HIGH (ref 0–99)
Triglycerides: 111 mg/dL (ref 0–149)
VLDL Cholesterol Cal: 20 mg/dL (ref 5–40)

## 2024-01-14 LAB — TSH+FREE T4
Free T4: 0.83 ng/dL (ref 0.82–1.77)
TSH: 3.82 u[IU]/mL (ref 0.450–4.500)

## 2024-01-14 LAB — PSA: Prostate Specific Ag, Serum: 3.6 ng/mL (ref 0.0–4.0)

## 2024-01-14 NOTE — Telephone Encounter (Signed)
 Okay pt has been notified. LS

## 2024-01-14 NOTE — Telephone Encounter (Signed)
 No we rx'd it last year and refilled it this year so I do want him on it still.

## 2024-01-14 NOTE — Telephone Encounter (Signed)
 Copied from CRM [id number]. Topic: Clinical - Medication Question >> Jan 14, 2024  8:38 AM Gustabo D wrote:  Patient wife has a question about the  prescription- rosuvastatin  (CRESTOR ) 10 MG tablet She said he was taken off of it but his pcp wrote a prescription for so she wants to know if he needs to take it

## 2024-01-20 ENCOUNTER — Telehealth: Payer: Self-pay | Admitting: Family Medicine

## 2024-01-20 NOTE — Telephone Encounter (Signed)
 Copied from CRM [id number]. Topic: General - Other >> Jan 20, 2024  9:44 AM Harlene ORN wrote: Reason for CRM: says that someone called his to get his CT and MRI scheduled. Said his insurance was incorrect (wrong subscriber ID) Deactivated the [employer].  Any further questions, please contact, Eleanor (wife) Wife Phone number:  [phone]

## 2024-02-07 ENCOUNTER — Ambulatory Visit (HOSPITAL_BASED_OUTPATIENT_CLINIC_OR_DEPARTMENT_OTHER)
Admission: RE | Admit: 2024-02-07 | Discharge: 2024-02-07 | Disposition: A | Discharge status: Home / Self Care | Source: Ambulatory Visit | Attending: Family Medicine | Admitting: Family Medicine

## 2024-02-07 ENCOUNTER — Encounter: Payer: Self-pay | Admitting: Family Medicine

## 2024-02-07 ENCOUNTER — Other Ambulatory Visit: Payer: Self-pay | Admitting: Family Medicine

## 2024-02-07 DIAGNOSIS — J439 Emphysema, unspecified: Secondary | ICD-10-CM | POA: Diagnosis not present

## 2024-02-07 DIAGNOSIS — N2889 Other specified disorders of kidney and ureter: Secondary | ICD-10-CM

## 2024-02-07 DIAGNOSIS — Z720 Tobacco use: Secondary | ICD-10-CM | POA: Insufficient documentation

## 2024-02-07 DIAGNOSIS — S2231XD Fracture of one rib, right side, subsequent encounter for fracture with routine healing: Secondary | ICD-10-CM | POA: Diagnosis not present

## 2024-02-07 DIAGNOSIS — R93422 Abnormal radiologic findings on diagnostic imaging of left kidney: Secondary | ICD-10-CM | POA: Insufficient documentation

## 2024-02-07 DIAGNOSIS — R634 Abnormal weight loss: Secondary | ICD-10-CM | POA: Insufficient documentation

## 2024-02-07 DIAGNOSIS — F40240 Claustrophobia: Secondary | ICD-10-CM

## 2024-02-07 MED ORDER — LORAZEPAM 1 MG PO TABS
ORAL_TABLET | ORAL | 0 refills | Status: AC
Start: 2024-02-07 — End: ?

## 2024-02-07 NOTE — Progress Notes (Signed)
 Meds ordered this encounter  Medications   LORazepam (ATIVAN) 1 MG tablet    Sig: Take 1 tablet 1 hour before scan. Repeat in 30 minutes if needed.    Dispense:  2 tablet    Refill:  0

## 2024-02-07 NOTE — Telephone Encounter (Signed)
 Is there an option for open MRI?

## 2024-02-11 ENCOUNTER — Telehealth (INDEPENDENT_AMBULATORY_CARE_PROVIDER_SITE_OTHER): Payer: Self-pay | Admitting: Family Medicine

## 2024-02-11 ENCOUNTER — Encounter: Payer: Self-pay | Admitting: Family Medicine

## 2024-02-11 DIAGNOSIS — R1031 Right lower quadrant pain: Secondary | ICD-10-CM

## 2024-02-11 DIAGNOSIS — R1032 Left lower quadrant pain: Secondary | ICD-10-CM | POA: Diagnosis not present

## 2024-02-11 NOTE — Progress Notes (Signed)
 Subjective:    Patient ID: [name] [name], male    DOB: 12-Oct-1965, 59 y.o.   MRN: [id number]   HPI: [name] [name] is a 58 y.o. male presenting for groin pain.  He has had about 40 kidney stones in the past.  He is concerned about another.  He had a CT about a month ago that was negative for ureteral stone.  He had a CT of the chest a few days ago.  It showed some left upper pole stones but nothing in the ureters.  There is a lesion of some type on the left that is being explored further with an MRI but that is not due for couple of weeks.  Meanwhile he is in a lot of pain.  He did not bring any urine specimen today.  Pain does not radiate to the flank.  There is no nausea vomiting diarrhea and there is no fever.      01/13/2024    8:53 AM 03/28/2023   10:33 AM 09/21/2022    1:49 PM 06/19/2021    3:21 PM 05/24/2021    4:45 PM  Depression screen PHQ 2/9  Decreased Interest 2 0 1 1 0  Down, Depressed, Hopeless 2 0 1 1 2   PHQ - 2 Score 4 0 2 2 2   Altered sleeping 2 2 3 1 2   Tired, decreased energy 2 0 1 2 1   Change in appetite 2 0 1 0 0  Feeling bad or failure about yourself  0 0 1 1 0  Trouble concentrating 0 1 3 2 2   Moving slowly or fidgety/restless 0 0 1 1 2   Suicidal thoughts 0 0 0 0 0  PHQ-9 Score 10 3 12 9 9   Difficult doing work/chores Somewhat difficult Somewhat difficult  Somewhat difficult Somewhat difficult     Relevant past medical, surgical, family and social history reviewed and updated as indicated.  Interim medical history since our last visit reviewed. Allergies and medications reviewed and updated.  ROS:  Review of Systems  Constitutional:  Negative for fever.  Respiratory:  Negative for shortness of breath.   Cardiovascular:  Negative for chest pain.  Genitourinary:  Positive for testicular pain. Negative for difficulty urinating and flank pain.  Musculoskeletal:  Negative for arthralgias.  Skin:  Negative for rash.     Social History   Tobacco  Use  Smoking Status Some Days   Current packs/day: 0.00   Average packs/day: 1 pack/day for 28.5 years (28.5 ttl pk-yrs)   Types: Cigarettes   Start date: 12/31/1992   Last attempt to quit: 07/09/2021   Years since quitting: 2.5   Passive exposure: Never  Smokeless Tobacco Never       Objective:     Wt Readings from Last 3 Encounters:  01/13/24 160 lb (72.6 kg)  03/28/23 173 lb (78.5 kg)  09/21/22 181 lb (82.1 kg)     Exam deferred. Video visit performed.   Assessment & Plan:  Bilateral groin pain -     Urinalysis, Complete -     Urine Culture      Diagnoses and all orders for this visit:  Bilateral groin pain -     Urinalysis, Complete -     Urine Culture    Virtual Visit  Note  I discussed the limitations, risks, security and privacy concerns of performing an evaluation and management service by video and the availability of in person appointments. The patient was identified with two identifiers.  Pt.expressed understanding and agreed to proceed. Pt. Is at home. Dr. Zollie is in his office.  Follow Up Instructions:   I discussed the assessment and treatment plan with the patient. The patient was provided an opportunity to ask questions and all were answered. The patient agreed with the plan and demonstrated an understanding of the instructions.   The patient was advised to call back or seek an in-person evaluation if the symptoms worsen or if the condition fails to improve as anticipated.   I asked the patient to bring in a urine specimen for evaluation for infection and for blood.  If there is blood we may need to do another CT urogram to see if he needs treatment for another ureteral stone immediately.   Follow up plan: No follow-ups on file.  Butler Zollie, MD [facility]

## 2024-02-14 ENCOUNTER — Ambulatory Visit (HOSPITAL_BASED_OUTPATIENT_CLINIC_OR_DEPARTMENT_OTHER)

## 2024-02-14 ENCOUNTER — Encounter (HOSPITAL_BASED_OUTPATIENT_CLINIC_OR_DEPARTMENT_OTHER): Payer: Self-pay

## 2024-02-24 ENCOUNTER — Ambulatory Visit
Admission: RE | Admit: 2024-02-24 | Discharge: 2024-02-24 | Disposition: A | Discharge status: Home / Self Care | Source: Ambulatory Visit | Attending: Family Medicine | Admitting: Family Medicine

## 2024-02-24 DIAGNOSIS — R93422 Abnormal radiologic findings on diagnostic imaging of left kidney: Secondary | ICD-10-CM

## 2024-02-24 DIAGNOSIS — D3502 Benign neoplasm of left adrenal gland: Secondary | ICD-10-CM | POA: Diagnosis not present

## 2024-02-24 DIAGNOSIS — N2889 Other specified disorders of kidney and ureter: Secondary | ICD-10-CM

## 2024-02-24 DIAGNOSIS — N281 Cyst of kidney, acquired: Secondary | ICD-10-CM | POA: Diagnosis not present

## 2024-02-24 MED ORDER — GADOPICLENOL 0.5 MMOL/ML IV SOLN
7.0000 mL | Freq: Once | INTRAVENOUS | Status: AC | PRN
  Administered 2024-02-24: 7 mL via INTRAVENOUS

## 2024-02-27 ENCOUNTER — Encounter: Payer: Self-pay | Admitting: Family Medicine

## 2024-02-27 NOTE — Telephone Encounter (Signed)
 Reviewed results with patient per PCP notes. Patient voiced understanding.  Says he is still very concerned because he has lost over 25 lbs and the pain he feels with right kidney feels like he has kidney stones. Wants to know if anything can be done for this, to figure out what is going on.

## 2024-02-28 ENCOUNTER — Other Ambulatory Visit: Payer: Self-pay | Admitting: Family Medicine

## 2024-02-28 DIAGNOSIS — W57XXXS Bitten or stung by nonvenomous insect and other nonvenomous arthropods, sequela: Secondary | ICD-10-CM
